# Patient Record
Sex: Male | Born: 1985
Health system: Southern US, Community
[De-identification: ages and names within clinical notes are randomized; demographics above are authoritative.]

## PROBLEM LIST (undated history)

## (undated) DIAGNOSIS — Z6833 Body mass index (BMI) 33.0-33.9, adult: Secondary | ICD-10-CM

## (undated) DIAGNOSIS — F988 Other specified behavioral and emotional disorders with onset usually occurring in childhood and adolescence: Secondary | ICD-10-CM

## (undated) DIAGNOSIS — E6609 Other obesity due to excess calories: Secondary | ICD-10-CM

## (undated) DIAGNOSIS — R7989 Other specified abnormal findings of blood chemistry: Secondary | ICD-10-CM

## (undated) HISTORY — DX: Other specified behavioral and emotional disorders with onset usually occurring in childhood and adolescence: F98.8

## (undated) HISTORY — DX: Other obesity due to excess calories: E66.09

## (undated) HISTORY — DX: Body mass index (bmi) 33.0-33.9, adult: Z68.33

## (undated) HISTORY — DX: Other specified abnormal findings of blood chemistry: R79.89

---
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
## 2014-11-19 HISTORY — PX: VASECTOMY: SHX75

## 2015-03-22 ENCOUNTER — Encounter: Payer: Self-pay | Admitting: Internal Medicine

## 2015-03-22 ENCOUNTER — Ambulatory Visit (INDEPENDENT_AMBULATORY_CARE_PROVIDER_SITE_OTHER): Payer: 59 | Admitting: Internal Medicine

## 2015-03-22 VITALS — BP 130/90 | HR 96 | Ht 70.0 in | Wt 217.0 lb

## 2015-03-22 DIAGNOSIS — Z3009 Encounter for other general counseling and advice on contraception: Secondary | ICD-10-CM

## 2015-03-22 DIAGNOSIS — Z309 Encounter for contraceptive management, unspecified: Secondary | ICD-10-CM

## 2015-03-22 DIAGNOSIS — F909 Attention-deficit hyperactivity disorder, unspecified type: Secondary | ICD-10-CM | POA: Diagnosis not present

## 2015-03-22 DIAGNOSIS — F988 Other specified behavioral and emotional disorders with onset usually occurring in childhood and adolescence: Secondary | ICD-10-CM | POA: Insufficient documentation

## 2015-03-22 MED ORDER — AMPHETAMINE-DEXTROAMPHETAMINE 30 MG PO TABS: ORAL_TABLET | ORAL | Status: DC

## 2015-03-22 MED ORDER — AMPHETAMINE-DEXTROAMPHETAMINE 20 MG PO TABS: ORAL_TABLET | ORAL | Status: DC

## 2015-03-22 MED ORDER — VITAMIN D 1000 UNITS PO TABS: 1000.0000 [IU] | ORAL_TABLET | Freq: Every day | ORAL | Status: AC

## 2015-03-22 NOTE — Assessment & Plan Note (Signed)
Adderral bid

## 2015-03-22 NOTE — Assessment & Plan Note (Signed)
  Dr Ottelin 

## 2015-03-22 NOTE — Progress Notes (Signed)
Pre visit review using our clinic review tool, if applicable. No additional management support is needed unless otherwise documented below in the visit note. 

## 2015-03-29 ENCOUNTER — Ambulatory Visit: Payer: 59 | Admitting: Internal Medicine

## 2015-03-29 NOTE — Progress Notes (Signed)
   Subjective:    Patient ID: Stephen Lawrence, male    DOB: 08-Feb-1986, 29 y.o.   MRN: [id number]  HPI  New pt Stephen Lawrence needs to address his ADD diagnosed a few years ago. He has been happy w/his current Rx regimen. Stephen Lawrence would like to be referred for vasectomy   Review of Systems  Constitutional: Negative for appetite change, fatigue and unexpected weight change.  HENT: Negative for congestion, dental problem, hearing loss, mouth sores, nosebleeds, sneezing, sore throat, tinnitus and trouble swallowing.   Eyes: Negative for itching and visual disturbance.  Respiratory: Negative for cough, chest tightness and wheezing.   Cardiovascular: Negative for chest pain, palpitations and leg swelling.  Gastrointestinal: Negative for nausea, abdominal pain, diarrhea, blood in stool and abdominal distention.  Genitourinary: Negative for frequency and hematuria.  Musculoskeletal: Negative for back pain, joint swelling, gait problem and neck pain.  Skin: Negative for rash and wound.  Neurological: Negative for dizziness, tremors, speech difficulty and weakness.  Psychiatric/Behavioral: Negative for suicidal ideas, hallucinations, behavioral problems, confusion, sleep disturbance, self-injury, dysphoric mood, decreased concentration and agitation. The patient is not nervous/anxious and is not hyperactive.        Objective:   Physical Exam  Constitutional: He is oriented to person, place, and time. He appears well-developed. No distress.  NAD  HENT:  Mouth/Throat: Oropharynx is clear and moist.  Eyes: Conjunctivae are normal. Pupils are equal, round, and reactive to light.  Neck: Normal range of motion. No JVD present. No thyromegaly present.  Cardiovascular: Normal rate, regular rhythm, normal heart sounds and intact distal pulses.  Exam reveals no gallop and no friction rub.   No murmur heard. Pulmonary/Chest: Effort normal and breath sounds normal. No respiratory distress. He has no wheezes. He  has no rales. He exhibits no tenderness.  Abdominal: Soft. Bowel sounds are normal. He exhibits no distension and no mass. There is no tenderness. There is no rebound and no guarding.  Musculoskeletal: Normal range of motion. He exhibits no edema or tenderness.  Lymphadenopathy:    He has no cervical adenopathy.  Neurological: He is alert and oriented to person, place, and time. He has normal reflexes. No cranial nerve deficit. He exhibits normal muscle tone. He displays a negative Romberg sign. Coordination and gait normal.  Skin: Skin is warm and dry. No rash noted.  Psychiatric: He has a normal mood and affect. His behavior is normal. Judgment and thought content normal.  Genitals exam - deferred         Assessment & Plan:

## 2015-05-17 ENCOUNTER — Encounter: Payer: Self-pay | Admitting: Internal Medicine

## 2015-05-30 MED ORDER — AMPHETAMINE-DEXTROAMPHETAMINE 20 MG PO TABS: ORAL_TABLET | ORAL | Status: DC

## 2015-05-30 MED ORDER — AMPHETAMINE-DEXTROAMPHETAMINE 30 MG PO TABS: ORAL_TABLET | ORAL | Status: DC

## 2015-05-30 NOTE — Telephone Encounter (Signed)
Needs Rx - done Stephen HaileyDustin, let me see you in Sept for a f/u Thx

## 2015-07-27 ENCOUNTER — Telehealth: Payer: Self-pay | Admitting: Internal Medicine

## 2015-07-27 MED ORDER — AMPHETAMINE-DEXTROAMPHETAMINE 20 MG PO TABS: ORAL_TABLET | ORAL | Status: DC

## 2015-07-27 MED ORDER — AMPHETAMINE-DEXTROAMPHETAMINE 30 MG PO TABS: ORAL_TABLET | ORAL | Status: DC

## 2015-07-27 NOTE — Telephone Encounter (Signed)
OK to fill this prescription with additional refills x0 Thank you!  

## 2015-07-27 NOTE — Telephone Encounter (Signed)
Rxs printed. Holding for MD sig at [facility] [employer]. Pt aware.

## 2015-07-27 NOTE — Telephone Encounter (Signed)
Pt needs refill on his amphetamine-dextroamphetamine (ADDERALL) 20 MG tablet [[id number]]

## 2015-08-23 ENCOUNTER — Telehealth: Payer: Self-pay | Admitting: Internal Medicine

## 2015-08-23 NOTE — Telephone Encounter (Signed)
Pt needs refill on both Adderall meds.

## 2015-08-24 ENCOUNTER — Other Ambulatory Visit: Payer: Self-pay

## 2015-08-24 MED ORDER — AMPHETAMINE-DEXTROAMPHETAMINE 20 MG PO TABS: ORAL_TABLET | ORAL | Status: DC

## 2015-08-24 MED ORDER — AMPHETAMINE-DEXTROAMPHETAMINE 30 MG PO TABS: ORAL_TABLET | ORAL | Status: DC

## 2015-08-30 ENCOUNTER — Ambulatory Visit (INDEPENDENT_AMBULATORY_CARE_PROVIDER_SITE_OTHER): Payer: 59 | Admitting: Internal Medicine

## 2015-08-30 ENCOUNTER — Encounter: Payer: Self-pay | Admitting: Internal Medicine

## 2015-08-30 VITALS — BP 118/80 | HR 84 | Temp 98.6°F | Wt 225.0 lb

## 2015-08-30 DIAGNOSIS — J069 Acute upper respiratory infection, unspecified: Secondary | ICD-10-CM | POA: Diagnosis not present

## 2015-08-30 DIAGNOSIS — F909 Attention-deficit hyperactivity disorder, unspecified type: Secondary | ICD-10-CM | POA: Diagnosis not present

## 2015-08-30 DIAGNOSIS — F988 Other specified behavioral and emotional disorders with onset usually occurring in childhood and adolescence: Secondary | ICD-10-CM

## 2015-08-30 MED ORDER — PROMETHAZINE-CODEINE 6.25-10 MG/5ML PO SYRP: 5.0000 mL | ORAL_SOLUTION | ORAL | Status: DC | PRN

## 2015-08-30 NOTE — Progress Notes (Signed)
   Subjective:  Patient ID: Stephen Lawrence, male    DOB: Mar 31, 1986  Age: 29 y.o. MRN: [id number]  CC: No chief complaint on file.   HPI Stephen Lawrence presents for URI sx's since yesterday  Outpatient Prescriptions Prior to Visit  Medication Sig Dispense Refill  . amphetamine-dextroamphetamine (ADDERALL) 20 MG tablet Take 1 q am 30 tablet 0  . amphetamine-dextroamphetamine (ADDERALL) 30 MG tablet Take after lunch 30 tablet 0  . cholecalciferol (VITAMIN D) 1000 UNITS tablet Take 1 tablet (1,000 Units total) by mouth daily. (Patient not taking: Reported on 08/30/2015) 100 tablet 3   No facility-administered medications prior to visit.    ROS Review of Systems  Constitutional: Positive for chills and fatigue. Negative for appetite change and unexpected weight change.  HENT: Positive for rhinorrhea, sinus pressure and sore throat. Negative for congestion, drooling, nosebleeds, sneezing and trouble swallowing.   Eyes: Negative for itching.  Respiratory: Positive for cough.   Cardiovascular: Negative for chest pain.  Gastrointestinal: Negative for nausea, diarrhea, blood in stool and abdominal distention.  Musculoskeletal: Negative for joint swelling.  Skin: Negative for rash.  Neurological: Negative for dizziness, tremors, speech difficulty and weakness.  Psychiatric/Behavioral: Negative for sleep disturbance, dysphoric mood and agitation. The patient is not nervous/anxious.     Objective:  BP 118/80 mmHg  Pulse 84  Temp(Src) 98.6 F (37 C) (Oral)  Wt 225 lb (102.059 kg)  SpO2 96%  BP Readings from Last 3 Encounters:  08/30/15 118/80  03/22/15 130/90    Wt Readings from Last 3 Encounters:  08/30/15 225 lb (102.059 kg)  03/22/15 217 lb (98.431 kg)    Physical Exam  Constitutional: He appears well-nourished. No distress.  HENT:  Mouth/Throat: No oropharyngeal exudate.  Neck: No thyromegaly present.  Cardiovascular: Regular rhythm.  Exam reveals no gallop.     Pulmonary/Chest: He has no wheezes. He has no rales.  Abdominal: He exhibits no distension. There is no tenderness.  Lymphadenopathy:    He has no cervical adenopathy.  Eryth throat  No results found for: WBC, HGB, HCT, PLT, GLUCOSE, CHOL, TRIG, HDL, LDLDIRECT, LDLCALC, ALT, AST, NA, K, CL, CREATININE, BUN, CO2, TSH, PSA, INR, GLUF, HGBA1C, MICROALBUR  Patient was never admitted.  Assessment & Plan:   Diagnoses and all orders for this visit:  Acute upper respiratory infection  Other orders -     promethazine-codeine (PHENERGAN WITH CODEINE) 6.25-10 MG/5ML syrup; Take 5 mLs by mouth every 4 (four) hours as needed.  I am having Mr. Sassano start on promethazine-codeine. I am also having him maintain his cholecalciferol, amphetamine-dextroamphetamine, and amphetamine-dextroamphetamine.  Meds ordered this encounter  Medications  . promethazine-codeine (PHENERGAN WITH CODEINE) 6.25-10 MG/5ML syrup    Sig: Take 5 mLs by mouth every 4 (four) hours as needed.    Dispense:  300 mL    Refill:  0     Follow-up: No Follow-up on file.  Sonda Primes, MD

## 2015-08-30 NOTE — Progress Notes (Signed)
Pre visit review using our clinic review tool, if applicable. No additional management support is needed unless otherwise documented below in the visit note. 

## 2015-08-30 NOTE — Patient Instructions (Signed)
Use over-the-counter  "cold" medicines  such as "Tylenol cold" , "Advil cold",  "Mucinex" or" Mucinex D"  for cough and congestion.   Avoid decongestants if you have high blood pressure and use "Afrin" nasal spray for nasal congestion as directed instead. Use" Delsym" or" Robitussin" cough syrup varietis for cough.  You can use plain "Tylenol" or "Advil" for fever, chills and achyness. Use Halls or Ricola cough drops.   "Common cold" symptoms are usually triggered by a virus.  The antibiotics are usually not necessary. On average, a" viral cold" illness would take 4-7 days to resolve. Please, make an appointment if you are not better or if you're worse.  

## 2015-08-30 NOTE — Assessment & Plan Note (Signed)
Chronic  On adderall  Potential benefits of a long term stimulants use as well as potential risks  and complications were explained to the patient and were aknowledged.

## 2015-08-30 NOTE — Assessment & Plan Note (Signed)
OTC meds Prom-cod Home today

## 2015-09-01 ENCOUNTER — Ambulatory Visit (INDEPENDENT_AMBULATORY_CARE_PROVIDER_SITE_OTHER): Payer: 59 | Admitting: Internal Medicine

## 2015-09-01 ENCOUNTER — Encounter: Payer: Self-pay | Admitting: Internal Medicine

## 2015-09-01 VITALS — BP 128/72 | HR 97 | Temp 98.2°F | Resp 16 | Ht 70.0 in | Wt 226.1 lb

## 2015-09-01 DIAGNOSIS — J069 Acute upper respiratory infection, unspecified: Secondary | ICD-10-CM

## 2015-09-01 MED ORDER — BENZONATATE 200 MG PO CAPS: 200.0000 mg | ORAL_CAPSULE | Freq: Three times a day (TID) | ORAL | Status: DC | PRN

## 2015-09-01 MED ORDER — AZITHROMYCIN 250 MG PO TABS: ORAL_TABLET | ORAL | Status: DC

## 2015-09-01 NOTE — Progress Notes (Signed)
   Subjective:    Patient ID: Stephen Lawrence, male    DOB: [birth year], 29 y.o.   MRN: [id number]  HPI The patient is a 29 YO man coming in for cough and SOB. He started having nasal congestion and drainage 4 days ago. Denies fevers or chills. Some mild myalgias. Started using nyquil and cough syrup with codeine which helps but makes him very drowsy so he can only take at night time. He is about 50% improved with the upper congestion but feels that his lungs are more congested. Still coughing with production. Mild decreased exercise tolerance. Fatigue.   Review of Systems  Constitutional: Positive for activity change and fatigue. Negative for fever, chills, appetite change and unexpected weight change.  HENT: Positive for congestion, postnasal drip and rhinorrhea. Negative for ear discharge, ear pain, facial swelling, hearing loss, sinus pressure, sore throat and trouble swallowing.   Eyes: Negative.   Respiratory: Positive for cough. Negative for chest tightness, shortness of breath and wheezing.   Cardiovascular: Negative for chest pain, palpitations and leg swelling.  Gastrointestinal: Positive for nausea. Negative for vomiting, abdominal pain, diarrhea and abdominal distention.  Musculoskeletal: Positive for myalgias.  Neurological: Negative.       Objective:   Physical Exam  Constitutional: He is oriented to person, place, and time. He appears well-developed and well-nourished.  HENT:  Head: Normocephalic and atraumatic.  Right Ear: External ear normal.  Left Ear: External ear normal.  Oropharynx with clear drainage and redness, nares swollen and redness minimal crusting  Eyes: EOM are normal.  Neck: Normal range of motion. No JVD present.  Cardiovascular: Normal rate and regular rhythm.   Pulmonary/Chest: Effort normal and breath sounds normal. No respiratory distress. He has no wheezes. He has no rales. He exhibits no tenderness.  Abdominal: Soft. Bowel sounds are normal. He  exhibits no distension. There is no tenderness. There is no rebound.  Lymphadenopathy:    He has no cervical adenopathy.  Neurological: He is alert and oriented to person, place, and time.  Skin: Skin is warm and dry.   Filed Vitals:   09/01/15 0825  BP: 128/72  Pulse: 97  Temp: 98.2 F (36.8 C)  TempSrc: Oral  Resp: 16  Height: 5\' 10"  (1.778 m)  Weight: 226 lb 1.9 oz (102.567 kg)  SpO2: 98%      Assessment & Plan:

## 2015-09-01 NOTE — Progress Notes (Signed)
Pre visit review using our clinic review tool, if applicable. No additional management support is needed unless otherwise documented below in the visit note. 

## 2015-09-01 NOTE — Assessment & Plan Note (Signed)
Still feel that this is viral and tessalon perles sent in to take during the daytime. Rx for azithromycin that he will start on Saturday if not improving. Explained likely course of symptoms and likely length of cough (several weeks).

## 2015-09-01 NOTE — Patient Instructions (Signed)
We have sent in tessalon perles which help to decrease the impulse to cough without making you sleepy. You can still use the cough syrup if you are at home.  We have also sent in azithromycin that you can start taking if you are not feeling better by Friday or Saturday. This is most likely still a virus which can take 7-10 days to be fully gone. Day 4-5 is the worst time for the virus.   If you need to take the antibiotic take 2 pills on day 1 then 1 pill daily on days 2-5.

## 2015-09-26 ENCOUNTER — Telehealth: Payer: Self-pay | Admitting: Internal Medicine

## 2015-09-26 NOTE — Telephone Encounter (Signed)
Pt needs refill on Both Adderalls

## 2015-09-27 MED ORDER — AMPHETAMINE-DEXTROAMPHETAMINE 20 MG PO TABS: ORAL_TABLET | ORAL | Status: DC

## 2015-09-27 MED ORDER — AMPHETAMINE-DEXTROAMPHETAMINE 30 MG PO TABS: ORAL_TABLET | ORAL | Status: DC

## 2015-09-27 NOTE — Telephone Encounter (Signed)
Rxs printed/signed/given to pt.

## 2015-09-27 NOTE — Telephone Encounter (Signed)
OK to fill both prescriptions with additional refills x0 Thank you!  

## 2015-10-25 ENCOUNTER — Telehealth: Payer: Self-pay | Admitting: Internal Medicine

## 2015-10-25 NOTE — Telephone Encounter (Signed)
Pt is requesting refill on adderall 

## 2015-10-25 NOTE — Telephone Encounter (Signed)
OK to fill this prescription with additional refills x0 Thank you!  

## 2015-10-26 MED ORDER — AMPHETAMINE-DEXTROAMPHETAMINE 30 MG PO TABS: ORAL_TABLET | ORAL | Status: DC

## 2015-10-26 MED ORDER — AMPHETAMINE-DEXTROAMPHETAMINE 20 MG PO TABS: ORAL_TABLET | ORAL | Status: DC

## 2015-10-26 NOTE — Telephone Encounter (Signed)
Rxs printed/signed/given to pt.  

## 2015-11-23 ENCOUNTER — Telehealth: Payer: Self-pay | Admitting: *Deleted
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
## 2015-11-23 NOTE — Telephone Encounter (Signed)
Yes. Thx.

## 2015-11-24 MED ORDER — AMPHETAMINE-DEXTROAMPHETAMINE 30 MG PO TABS: ORAL_TABLET | ORAL | Status: DC

## 2015-11-24 MED ORDER — AMPHETAMINE-DEXTROAMPHETAMINE 20 MG PO TABS
ORAL_TABLET | ORAL | Status: DC
Start: 2015-11-24 — End: 2015-12-06

## 2015-11-24 MED FILL — AMPHETAMINE SALTS 30 MG TAB: 30 | 30 days supply | Qty: 30 | Fill #0

## 2015-11-24 MED FILL — AMPHETAMINE SALTS 20 MG TAB: 20 | 30 days supply | Qty: 30 | Fill #0

## 2015-11-24 NOTE — Telephone Encounter (Signed)
Rx printed/signed/given to pt.

## 2015-12-01 ENCOUNTER — Encounter: Payer: 59 | Admitting: Internal Medicine

## 2015-12-06 ENCOUNTER — Encounter: Payer: Self-pay | Admitting: Internal Medicine

## 2015-12-06 ENCOUNTER — Ambulatory Visit (INDEPENDENT_AMBULATORY_CARE_PROVIDER_SITE_OTHER): Payer: 59 | Admitting: Internal Medicine

## 2015-12-06 VITALS — BP 112/74 | HR 72 | Ht 70.0 in | Wt 217.0 lb

## 2015-12-06 DIAGNOSIS — Z0000 Encounter for general adult medical examination without abnormal findings: Secondary | ICD-10-CM | POA: Diagnosis not present

## 2015-12-06 DIAGNOSIS — F988 Other specified behavioral and emotional disorders with onset usually occurring in childhood and adolescence: Secondary | ICD-10-CM

## 2015-12-06 MED ORDER — AMPHETAMINE-DEXTROAMPHETAMINE 20 MG PO TABS: ORAL_TABLET | ORAL | Status: DC

## 2015-12-06 MED ORDER — AMPHETAMINE-DEXTROAMPHETAMINE 30 MG PO TABS: ORAL_TABLET | ORAL | Status: DC

## 2015-12-06 NOTE — Assessment & Plan Note (Signed)
We discussed age appropriate health related issues, including available/recomended screening tests and vaccinations. We discussed a need for adhering to healthy diet and exercise. Labs/EKG were reviewed/ordered. All questions were answered.   

## 2015-12-06 NOTE — Progress Notes (Signed)
Subjective:  Patient ID: Stephen Lawrence, male    DOB: 28-Feb-1986  Age: 30 y.o. MRN: [id number]  CC: Annual Exam   HPI Stephen Lawrence presents for a well exam. F/u ADD. Pt lost wt on diet  Outpatient Prescriptions Prior to Visit  Medication Sig Dispense Refill  . amphetamine-dextroamphetamine (ADDERALL) 20 MG tablet Take 1 q am 30 tablet 0  . amphetamine-dextroamphetamine (ADDERALL) 30 MG tablet Take after lunch 30 tablet 0  . cholecalciferol (VITAMIN D) 1000 UNITS tablet Take 1 tablet (1,000 Units total) by mouth daily. (Patient not taking: Reported on 12/06/2015) 100 tablet 3  . azithromycin (ZITHROMAX) 250 MG tablet Day 1 take 2 pills, days 2-5 take 1 pill daily. (Patient not taking: Reported on 12/06/2015) 6 tablet 0  . benzonatate (TESSALON) 200 MG capsule Take 1 capsule (200 mg total) by mouth 3 (three) times daily as needed for cough. (Patient not taking: Reported on 12/06/2015) 90 capsule 0  . promethazine-codeine (PHENERGAN WITH CODEINE) 6.25-10 MG/5ML syrup Take 5 mLs by mouth every 4 (four) hours as needed. (Patient not taking: Reported on 12/06/2015) 300 mL 0   No facility-administered medications prior to visit.    ROS Review of Systems  Constitutional: Negative for appetite change, fatigue and unexpected weight change.  HENT: Negative for congestion, nosebleeds, sneezing, sore throat and trouble swallowing.   Eyes: Negative for itching and visual disturbance.  Respiratory: Negative for cough.   Cardiovascular: Negative for chest pain, palpitations and leg swelling.  Gastrointestinal: Negative for nausea, diarrhea, blood in stool and abdominal distention.  Genitourinary: Negative for frequency and hematuria.  Musculoskeletal: Negative for back pain, joint swelling, gait problem and neck pain.  Skin: Negative for rash.  Neurological: Negative for dizziness, tremors, speech difficulty and weakness.  Psychiatric/Behavioral: Negative for suicidal ideas, sleep disturbance,  dysphoric mood and agitation. The patient is not nervous/anxious.     Objective:  BP 112/74 mmHg  Pulse 72  Ht  (1.778 m)  Wt 217 lb (98.431 kg)  BMI 31.14 kg/m2  SpO2 98%  BP Readings from Last 3 Encounters:  12/06/15 112/74  09/01/15 128/72  08/30/15 118/80    Wt Readings from Last 3 Encounters:  12/06/15 217 lb (98.431 kg)  09/01/15 226 lb 1.9 oz (102.567 kg)  08/30/15 225 lb (102.059 kg)    Physical Exam  Constitutional: He is oriented to person, place, and time. He appears well-developed. No distress.  NAD  HENT:  Mouth/Throat: Oropharynx is clear and moist.  Eyes: Conjunctivae are normal. Pupils are equal, round, and reactive to light.  Neck: Normal range of motion. No JVD present. No thyromegaly present.  Cardiovascular: Normal rate, regular rhythm, normal heart sounds and intact distal pulses.  Exam reveals no gallop and no friction rub.   No murmur heard. Pulmonary/Chest: Effort normal and breath sounds normal. No respiratory distress. He has no wheezes. He has no rales. He exhibits no tenderness.  Abdominal: Soft. Bowel sounds are normal. He exhibits no distension and no mass. There is no tenderness. There is no rebound and no guarding.  Musculoskeletal: Normal range of motion. He exhibits tenderness. He exhibits no edema.  Lymphadenopathy:    He has no cervical adenopathy.  Neurological: He is alert and oriented to person, place, and time. He has normal reflexes. No cranial nerve deficit. He exhibits normal muscle tone. He displays a negative Romberg sign. Coordination and gait normal.  Skin: Skin is warm and dry. No rash noted.  Psychiatric: He has  a normal mood and affect. His behavior is normal. Judgment and thought content normal.    No results found for: WBC, HGB, HCT, PLT, GLUCOSE, CHOL, TRIG, HDL, LDLDIRECT, LDLCALC, ALT, AST, NA, K, CL, CREATININE, BUN, CO2, TSH, PSA, INR, GLUF, HGBA1C, MICROALBUR  Patient was never admitted.  Assessment & Plan:     Stephen Lawrence was seen today for annual exam.  Diagnoses and all orders for this visit:  Well adult exam -     CBC with Differential/Platelet; Future -     Basic metabolic panel; Future -     Hepatic function panel; Future -     Lipid panel; Future -     TSH; Future -     Urinalysis; Future  Other orders -     Discontinue: amphetamine-dextroamphetamine (ADDERALL) 20 MG tablet; Take 1 q am -     Discontinue: amphetamine-dextroamphetamine (ADDERALL) 30 MG tablet; Take after lunch -     Discontinue: amphetamine-dextroamphetamine (ADDERALL) 20 MG tablet; Take 1 q am -     Discontinue: amphetamine-dextroamphetamine (ADDERALL) 30 MG tablet; Take after lunch -     amphetamine-dextroamphetamine (ADDERALL) 20 MG tablet; Take 1 q am -     amphetamine-dextroamphetamine (ADDERALL) 30 MG tablet; Take after lunch  I have discontinued Mr. Cull's promethazine-codeine, benzonatate, and azithromycin. I am also having him maintain his cholecalciferol, amphetamine-dextroamphetamine, and amphetamine-dextroamphetamine.  Meds ordered this encounter  Medications  . DISCONTD: amphetamine-dextroamphetamine (ADDERALL) 20 MG tablet    Sig: Take 1 q am    Dispense:  30 tablet    Refill:  0    Please fill on 12/25/15.  Marland Kitchen DISCONTD: amphetamine-dextroamphetamine (ADDERALL) 30 MG tablet    Sig: Take after lunch    Dispense:  30 tablet    Refill:  0    Please fill on 12/25/15.  Marland Kitchen DISCONTD: amphetamine-dextroamphetamine (ADDERALL) 20 MG tablet    Sig: Take 1 q am    Dispense:  30 tablet    Refill:  0    Please fill on 01/22/16.  Marland Kitchen DISCONTD: amphetamine-dextroamphetamine (ADDERALL) 30 MG tablet    Sig: Take after lunch    Dispense:  30 tablet    Refill:  0    Please fill on 01/22/16.  Marland Kitchen amphetamine-dextroamphetamine (ADDERALL) 20 MG tablet    Sig: Take 1 q am    Dispense:  30 tablet    Refill:  0    Please fill on 02/22/16.  Marland Kitchen amphetamine-dextroamphetamine (ADDERALL) 30 MG tablet    Sig: Take after lunch     Dispense:  30 tablet    Refill:  0    Please fill on 02/22/16.     Follow-up: Return in about 3 months (around 03/05/2016) for a follow-up visit.  Sonda Primes, MD

## 2015-12-06 NOTE — Progress Notes (Signed)
Pre visit review using our clinic review tool, if applicable. No additional management support is needed unless otherwise documented below in the visit note. 

## 2015-12-06 NOTE — Assessment & Plan Note (Signed)
Adderall renewed.

## 2015-12-26 MED FILL — AMPHETAMINE SALTS 20 MG TAB: 20 | 30 days supply | Qty: 30 | Fill #0

## 2015-12-26 MED FILL — AMPHETAMINE SALTS 30 MG TAB: 30 | 30 days supply | Qty: 30 | Fill #0

## 2016-01-23 MED FILL — DEXTROAMP-AMP 30 MG TABLET: 30 | 30 days supply | Qty: 30 | Fill #0

## 2016-01-23 MED FILL — AMPHETAMINE SALTS 20 MG TAB: 20 | 30 days supply | Qty: 30 | Fill #0

## 2016-02-20 MED FILL — AMPHETAMINE SALTS 20 MG TAB: 20 | 30 days supply | Qty: 30 | Fill #0

## 2016-02-20 MED FILL — DEXTROAMP-AMP 30 MG TABLET: 30 | 30 days supply | Qty: 30 | Fill #0

## 2016-02-27 ENCOUNTER — Encounter: Payer: Self-pay | Admitting: Internal Medicine

## 2016-02-27 ENCOUNTER — Ambulatory Visit (INDEPENDENT_AMBULATORY_CARE_PROVIDER_SITE_OTHER): Payer: 59 | Admitting: Internal Medicine

## 2016-02-27 VITALS — BP 118/80 | HR 61 | Temp 97.8°F | Resp 16 | Ht 70.0 in | Wt 218.0 lb

## 2016-02-27 DIAGNOSIS — J0100 Acute maxillary sinusitis, unspecified: Secondary | ICD-10-CM

## 2016-02-27 DIAGNOSIS — H68012 Acute Eustachian salpingitis, left ear: Secondary | ICD-10-CM | POA: Diagnosis not present

## 2016-02-27 DIAGNOSIS — H68019 Acute Eustachian salpingitis, unspecified ear: Secondary | ICD-10-CM | POA: Insufficient documentation

## 2016-02-27 MED ORDER — METHYLPREDNISOLONE ACETATE 80 MG/ML IJ SUSP
120.0000 mg | Freq: Once | INTRAMUSCULAR | Status: AC
  Administered 2016-02-27: 120 mg via INTRAMUSCULAR

## 2016-02-27 MED ORDER — AMOXICILLIN-POT CLAVULANATE 875-125 MG PO TABS: 1.0000 | ORAL_TABLET | Freq: Two times a day (BID) | ORAL | Status: DC

## 2016-02-27 MED ORDER — CETIRIZINE-PSEUDOEPHEDRINE ER 5-120 MG PO TB12: 1.0000 | ORAL_TABLET | Freq: Two times a day (BID) | ORAL | Status: DC

## 2016-02-27 MED FILL — AMOX-CLAV 875-125 MG TABLET: 875-125 | 10 days supply | Qty: 20 | Fill #0

## 2016-02-27 NOTE — Progress Notes (Signed)
Subjective:  Patient ID: Stephen Lawrence, male    DOB: 02/19/1986  Age: 30 y.o. MRN: [id number]  CC: Sinusitis   HPI Stephen Lawrence presents for a 3 day hx of severe nasal congestion, PND, right facial pain, left earache, think/yellow nasal phlegm, and chills but no fever.  Outpatient Prescriptions Prior to Visit  Medication Sig Dispense Refill  . amphetamine-dextroamphetamine (ADDERALL) 20 MG tablet Take 1 q am 30 tablet 0  . amphetamine-dextroamphetamine (ADDERALL) 30 MG tablet Take after lunch 30 tablet 0  . cholecalciferol (VITAMIN D) 1000 UNITS tablet Take 1 tablet (1,000 Units total) by mouth daily. (Patient not taking: Reported on 02/27/2016) 100 tablet 3   No facility-administered medications prior to visit.    ROS Review of Systems  Constitutional: Positive for chills. Negative for fever, diaphoresis, activity change, appetite change, fatigue and unexpected weight change.  HENT: Positive for congestion, ear pain, postnasal drip, rhinorrhea, sinus pressure and sneezing. Negative for facial swelling, sore throat and trouble swallowing.   Eyes: Negative.   Respiratory: Negative.  Negative for cough, choking, chest tightness, shortness of breath and stridor.   Cardiovascular: Negative.  Negative for chest pain, palpitations and leg swelling.  Gastrointestinal: Negative.  Negative for nausea, vomiting, abdominal pain, diarrhea and constipation.  Endocrine: Negative.   Genitourinary: Negative.   Musculoskeletal: Negative.  Negative for back pain, arthralgias and neck pain.  Skin: Negative.  Negative for color change and rash.  Allergic/Immunologic: Negative.   Neurological: Negative.   Hematological: Negative.  Negative for adenopathy. Does not bruise/bleed easily.  Psychiatric/Behavioral: Negative.     Objective:  BP 118/80 mmHg  Pulse 61  Temp(Src) 97.8 F (36.6 C) (Oral)  Resp 16  Ht 5\' 10"  (1.778 m)  Wt 218 lb (98.884 kg)  BMI 31.28 kg/m2  SpO2 98%  BP Readings  from Last 3 Encounters:  02/27/16 118/80  12/06/15 112/74  09/01/15 128/72    Wt Readings from Last 3 Encounters:  02/27/16 218 lb (98.884 kg)  12/06/15 217 lb (98.431 kg)  09/01/15 226 lb 1.9 oz (102.567 kg)    Physical Exam  Constitutional:  Non-toxic appearance. He does not have a sickly appearance. He does not appear ill. No distress.  HENT:  Right Ear: Hearing, tympanic membrane, external ear and ear canal normal.  Left Ear: Hearing, external ear and ear canal normal. Tympanic membrane is erythematous and retracted. Tympanic membrane is not injected and not perforated.  Nose: Mucosal edema and rhinorrhea present. Right sinus exhibits maxillary sinus tenderness. Right sinus exhibits no frontal sinus tenderness. Left sinus exhibits no maxillary sinus tenderness.  Mouth/Throat: Oropharynx is clear and moist and mucous membranes are normal. Mucous membranes are not pale, not dry and not cyanotic. No oral lesions. No trismus in the jaw. No uvula swelling. No oropharyngeal exudate, posterior oropharyngeal edema, posterior oropharyngeal erythema or tonsillar abscesses.  Eyes: Conjunctivae are normal. Right eye exhibits no discharge. Left eye exhibits no discharge. No scleral icterus.  Neck: Normal range of motion. Neck supple. No JVD present. No tracheal deviation present. No thyromegaly present.  Cardiovascular: Normal rate, regular rhythm, normal heart sounds and intact distal pulses.  Exam reveals no gallop and no friction rub.   No murmur heard. Pulmonary/Chest: Effort normal and breath sounds normal. No stridor. No respiratory distress. He has no wheezes. He has no rales. He exhibits no tenderness.  Abdominal: Soft. Bowel sounds are normal. He exhibits no distension and no mass. There is no tenderness. There is no  rebound and no guarding.  Musculoskeletal: Normal range of motion. He exhibits no edema or tenderness.  Lymphadenopathy:    He has no cervical adenopathy.  Skin: He is not  diaphoretic.    No results found for: WBC, HGB, HCT, PLT, GLUCOSE, CHOL, TRIG, HDL, LDLDIRECT, LDLCALC, ALT, AST, NA, K, CL, CREATININE, BUN, CO2, TSH, PSA, INR, GLUF, HGBA1C, MICROALBUR  Patient was never admitted.  Assessment & Plan:   Stephen Lawrence was seen today for sinusitis.  Diagnoses and all orders for this visit:  Acute maxillary sinusitis, recurrence not specified -     amoxicillin-clavulanate (AUGMENTIN) 875-125 MG tablet; Take 1 tablet by mouth 2 (two) times daily. -     cetirizine-pseudoephedrine (ZYRTEC-D ALLERGY & CONGESTION) 5-120 MG tablet; Take 1 tablet by mouth 2 (two) times daily. -     methylPREDNISolone acetate (DEPO-MEDROL) injection 120 mg; Inject 1.5 mLs (120 mg total) into the muscle once.  Acute eustachian tube salpingitis, left -     cetirizine-pseudoephedrine (ZYRTEC-D ALLERGY & CONGESTION) 5-120 MG tablet; Take 1 tablet by mouth 2 (two) times daily.   I am having Stephen Lawrence start on amoxicillin-clavulanate and cetirizine-pseudoephedrine. I am also having him maintain his cholecalciferol, amphetamine-dextroamphetamine, and amphetamine-dextroamphetamine. We administered methylPREDNISolone acetate.  Meds ordered this encounter  Medications  . amoxicillin-clavulanate (AUGMENTIN) 875-125 MG tablet    Sig: Take 1 tablet by mouth 2 (two) times daily.    Dispense:  20 tablet    Refill:  0  . cetirizine-pseudoephedrine (ZYRTEC-D ALLERGY & CONGESTION) 5-120 MG tablet    Sig: Take 1 tablet by mouth 2 (two) times daily.    Dispense:  60 tablet    Refill:  3  . methylPREDNISolone acetate (DEPO-MEDROL) injection 120 mg    Sig:      Follow-up: Return if symptoms worsen or fail to improve.  Sanda Linger, MD

## 2016-02-27 NOTE — Progress Notes (Signed)
Pre visit review using our clinic review tool, if applicable. No additional management support is needed unless otherwise documented below in the visit note. 

## 2016-02-27 NOTE — Patient Instructions (Signed)

## 2016-03-21 ENCOUNTER — Other Ambulatory Visit: Payer: Self-pay | Admitting: Internal Medicine

## 2016-03-22 ENCOUNTER — Telehealth: Payer: Self-pay | Admitting: Geriatric Medicine

## 2016-03-22 MED ORDER — AMPHETAMINE-DEXTROAMPHETAMINE 20 MG PO TABS: ORAL_TABLET | ORAL | Status: DC

## 2016-03-22 MED ORDER — AMPHETAMINE-DEXTROAMPHETAMINE 30 MG PO TABS: ORAL_TABLET | ORAL | Status: DC

## 2016-03-22 NOTE — Telephone Encounter (Signed)
Done. Thx.

## 2016-03-22 NOTE — Telephone Encounter (Signed)
Patient needs a refill on his adderalls, please advise, thanks.

## 2016-03-23 MED FILL — DEXTROAMP-AMPHETAMIN 20 MG: 20 | 30 days supply | Qty: 30 | Fill #0

## 2016-03-23 MED FILL — AMPHETAMINE SALTS 30 MG TAB: 30 | 30 days supply | Qty: 30 | Fill #0

## 2016-03-30 ENCOUNTER — Telehealth: Payer: Self-pay | Admitting: *Deleted

## 2016-03-30 DIAGNOSIS — Z201 Contact with and (suspected) exposure to tuberculosis: Secondary | ICD-10-CM

## 2016-03-30 NOTE — Telephone Encounter (Signed)
Pt requesting TB Gold test. He states he was possibly exposed here in [facility] office by a TB positive pt Ok to order lab per PCP.

## 2016-04-23 ENCOUNTER — Other Ambulatory Visit: Payer: Self-pay | Admitting: Internal Medicine

## 2016-04-27 MED ORDER — AMPHETAMINE-DEXTROAMPHETAMINE 20 MG PO TABS: ORAL_TABLET | ORAL | Status: DC

## 2016-04-27 MED ORDER — AMPHETAMINE-DEXTROAMPHETAMINE 30 MG PO TABS: ORAL_TABLET | ORAL | Status: DC

## 2016-04-27 MED FILL — DEXTROAMP-AMP 30 MG TABLET: 30 | 30 days supply | Qty: 30 | Fill #0

## 2016-04-27 MED FILL — DEXTROAMP-AMPHETAMIN 20 MG: 20 | 30 days supply | Qty: 30 | Fill #0

## 2016-04-27 NOTE — Telephone Encounter (Signed)
adderrall rx printed, waiting for plot to sign

## 2016-04-27 NOTE — Telephone Encounter (Signed)
rx signed and handed to Adolphe

## 2016-04-27 NOTE — Addendum Note (Signed)
Addended by: Alonna MiniumWILLIAMS, TAMARA P on: 04/27/2016 08:27 AM   Modules accepted: Orders

## 2016-05-14 ENCOUNTER — Encounter: Payer: Self-pay | Admitting: Family

## 2016-05-14 ENCOUNTER — Ambulatory Visit (INDEPENDENT_AMBULATORY_CARE_PROVIDER_SITE_OTHER): Payer: 59 | Admitting: Family

## 2016-05-14 VITALS — BP 132/92 | HR 68 | Temp 97.9°F | Resp 16 | Ht 70.0 in | Wt 220.0 lb

## 2016-05-14 DIAGNOSIS — J309 Allergic rhinitis, unspecified: Secondary | ICD-10-CM

## 2016-05-14 MED ORDER — HYDROCOD POLST-CPM POLST ER 10-8 MG/5ML PO SUER: 5.0000 mL | Freq: Every evening | ORAL | Status: DC | PRN

## 2016-05-14 MED FILL — HYDROCODONE-CHLORPHENIRAM S: 10-8 | 23 days supply | Qty: 115 | Fill #0

## 2016-05-14 NOTE — Progress Notes (Signed)
Subjective:    Patient ID: Stephen Lawrence, male    DOB: Dec 01, 1985, 30 y.o.   MRN: [id number]  Chief Complaint  Patient presents with  . Cough    was moving saturday and thinks that he inhaled a lot of dust, has a productive cough, both nasal and chest congestion    HPI:  Stephen Lawrence is a 30 y.o. male who  has a past medical history of ADD (attention deficit disorder). and presents today for an acute office visit.  This is a new problem.Associated symptom of itchy watery eyes, rhinorrhea, chest tightness, shortness of breath with productive cough producing a yellow to brown phlegm has been going on for approximately 2-1/2 days. Reports overall malaise. Timing of symptoms is generally worsen 90 and in the mornings. No fevers. Severity is enough to affect his sleep. Modifying factors include Advil sinus which has helped a little. Most recent antibiotic use within the last 2 months.   No Known Allergies   Current Outpatient Prescriptions on File Prior to Visit  Medication Sig Dispense Refill  . amphetamine-dextroamphetamine (ADDERALL) 20 MG tablet Take 1 q am 30 tablet 0  . amphetamine-dextroamphetamine (ADDERALL) 30 MG tablet Take after lunch 30 tablet 0   No current facility-administered medications on file prior to visit.    Review of Systems  Constitutional: Positive for fatigue. Negative for fever, chills and diaphoresis.  HENT: Positive for congestion, postnasal drip, rhinorrhea and sneezing.   Eyes: Positive for itching.  Respiratory: Positive for cough, chest tightness and shortness of breath.   Neurological: Negative for headaches.      Objective:    BP 132/92 mmHg  Pulse 68  Temp(Src) 97.9 F (36.6 C) (Oral)  Resp 16  Ht 5\' 10"  (1.778 m)  Wt 220 lb (99.791 kg)  BMI 31.57 kg/m2  SpO2 99% Nursing note and vital signs reviewed.  Physical Exam  Constitutional: He is oriented to person, place, and time. He appears well-developed and well-nourished. No  distress.  HENT:  Right Ear: Hearing, tympanic membrane, external ear and ear canal normal.  Left Ear: Hearing, tympanic membrane, external ear and ear canal normal.  Nose: Rhinorrhea present. No mucosal edema or sinus tenderness. Right sinus exhibits no maxillary sinus tenderness and no frontal sinus tenderness. Left sinus exhibits no maxillary sinus tenderness and no frontal sinus tenderness.  Mouth/Throat: Uvula is midline and mucous membranes are normal.  Post-nasal drip present  Cardiovascular: Normal rate, regular rhythm, normal heart sounds and intact distal pulses.   Pulmonary/Chest: Effort normal and breath sounds normal. No respiratory distress. He has no wheezes. He has no rales. He exhibits no tenderness.  Neurological: He is alert and oriented to person, place, and time.  Skin: Skin is warm and dry.  Psychiatric: He has a normal mood and affect. His behavior is normal. Judgment and thought content normal.       Assessment & Plan:   Problem List Items Addressed This Visit      Respiratory   Allergic rhinitis - Primary    Symptoms and exam consistent with allergic rhinitis most likely triggered by dust. Start second generation antihistamine and inhaled nasal corticosteroid. Start Tussionex as needed for cough and sleep. Continue over-the-counter medications as needed for symptom relief and supportive care. Follow-up if symptoms worsen or do not improve.      Relevant Medications   chlorpheniramine-HYDROcodone (TUSSIONEX PENNKINETIC ER) 10-8 MG/5ML SUER       I have discontinued Mr. Resh's amoxicillin-clavulanate and  cetirizine-pseudoephedrine. I am also having him start on chlorpheniramine-HYDROcodone. Additionally, I am having him maintain his amphetamine-dextroamphetamine and amphetamine-dextroamphetamine.   Meds ordered this encounter  Medications  . chlorpheniramine-HYDROcodone (TUSSIONEX PENNKINETIC ER) 10-8 MG/5ML SUER    Sig: Take 5 mLs by mouth at bedtime as  needed.    Dispense:  115 mL    Refill:  0    Order Specific Question:  Supervising Provider    Answer:  Hillard DankerRAWFORD, ELIZABETH A [[id number]]     Follow-up: Return if symptoms worsen or fail to improve.  Jeanine Luzalone, Gregory, FNP

## 2016-05-14 NOTE — Assessment & Plan Note (Signed)
Symptoms and exam consistent with allergic rhinitis most likely triggered by dust. Start second generation antihistamine and inhaled nasal corticosteroid. Start Tussionex as needed for cough and sleep. Continue over-the-counter medications as needed for symptom relief and supportive care. Follow-up if symptoms worsen or do not improve.

## 2016-05-14 NOTE — Patient Instructions (Signed)
Thank you for choosing [facility].  Summary/Instructions:  Your prescription(s) have been submitted to your pharmacy or been printed and provided for you. Please take as directed and contact our office if you believe you are having problem(s) with the medication(s) or have any questions.  If your symptoms worsen or fail to improve, please contact our office for further instruction, or in case of emergency go directly to the emergency room at the closest medical facility.   General Recommendations:    Please drink plenty of fluids.  Get plenty of rest   Sleep in humidified air  Use saline nasal sprays  Netti pot   OTC Medications:  Decongestants - helps relieve congestion   Flonase (generic fluticasone) or Nasacort (generic triamcinolone) - please make sure to use the "cross-over" technique at a 45 degree angle towards the opposite eye as opposed to straight up the nasal passageway.   Sudafed (generic pseudoephedrine - Note this is the one that is available behind the pharmacy counter); Products with phenylephrine (-PE) may also be used but is often not as effective as pseudoephedrine.   If you have HIGH BLOOD PRESSURE - Coricidin HBP; AVOID any product that is -D as this contains pseudoephedrine which may increase your blood pressure.  Afrin (oxymetazoline) every 6-8 hours for up to 3 days.   Allergies - helps relieve runny nose, itchy eyes and sneezing   Claritin (generic loratidine), Allegra (fexofenidine), Xyzal (levocyterizine) or Zyrtec (generic cyrterizine) for runny nose. These medications should not cause drowsiness.  Note - Benadryl (generic diphenhydramine) may be used however may cause drowsiness  Cough -   Delsym or Robitussin (generic dextromethorphan)  Expectorants - helps loosen mucus to ease removal   Mucinex (generic guaifenesin) as directed on the package.  Headaches / General Aches   Tylenol (generic acetaminophen) - DO NOT EXCEED 3  grams (3,000 mg) in a 24 hour time period  Advil/Motrin (generic ibuprofen)   Sore Throat -   Salt water gargle   Chloraseptic (generic benzocaine) spray or lozenges / Sucrets (generic dyclonine)    Allergic Rhinitis Allergic rhinitis is when the mucous membranes in the nose respond to allergens. Allergens are particles in the air that cause your body to have an allergic reaction. This causes you to release allergic antibodies. Through a chain of events, these eventually cause you to release histamine into the blood stream. Although meant to protect the body, it is this release of histamine that causes your discomfort, such as frequent sneezing, congestion, and an itchy, runny nose.  CAUSES Seasonal allergic rhinitis (hay fever) is caused by pollen allergens that may come from grasses, trees, and weeds. Year-round allergic rhinitis (perennial allergic rhinitis) is caused by allergens such as house dust mites, pet dander, and mold spores. SYMPTOMS  Nasal stuffiness (congestion).  Itchy, runny nose with sneezing and tearing of the eyes. DIAGNOSIS Your health care provider can help you determine the allergen or allergens that trigger your symptoms. If you and your health care provider are unable to determine the allergen, skin or blood testing may be used. Your health care provider will diagnose your condition after taking your health history and performing a physical exam. Your health care provider may assess you for other related conditions, such as asthma, pink eye, or an ear infection. TREATMENT Allergic rhinitis does not have a cure, but it can be controlled by:  Medicines that block allergy symptoms. These may include allergy shots, nasal sprays, and oral antihistamines.  Avoiding the allergen.  Hay fever may often be treated with antihistamines in pill or nasal spray forms. Antihistamines block the effects of histamine. There are over-the-counter medicines that may help with nasal  congestion and swelling around the eyes. Check with your health care provider before taking or giving this medicine. If avoiding the allergen or the medicine prescribed do not work, there are many new medicines your health care provider can prescribe. Stronger medicine may be used if initial measures are ineffective. Desensitizing injections can be used if medicine and avoidance does not work. Desensitization is when a patient is given ongoing shots until the body becomes less sensitive to the allergen. Make sure you follow up with your health care provider if problems continue. HOME CARE INSTRUCTIONS It is not possible to completely avoid allergens, but you can reduce your symptoms by taking steps to limit your exposure to them. It helps to know exactly what you are allergic to so that you can avoid your specific triggers. SEEK MEDICAL CARE IF:  You have a fever.  You develop a cough that does not stop easily (persistent).  You have shortness of breath.  You start wheezing.  Symptoms interfere with normal daily activities.   This information is not intended to replace advice given to you by your health care provider. Make sure you discuss any questions you have with your health care provider.   Document Released: 07/31/2001 Document Revised: 11/26/2014 Document Reviewed: 07/13/2013 Elsevier Interactive Patient Education [employer]2016 Elsevier Inc.

## 2016-05-14 NOTE — Progress Notes (Signed)
Pre visit review using our clinic review tool, if applicable. No additional management support is needed unless otherwise documented below in the visit note. 

## 2016-05-23 ENCOUNTER — Telehealth: Payer: Self-pay | Admitting: Internal Medicine

## 2016-05-24 MED ORDER — AMPHETAMINE-DEXTROAMPHETAMINE 30 MG PO TABS: ORAL_TABLET | ORAL | Status: DC

## 2016-05-24 MED ORDER — AMPHETAMINE-DEXTROAMPHETAMINE 20 MG PO TABS: ORAL_TABLET | ORAL | Status: DC

## 2016-05-24 NOTE — Addendum Note (Signed)
Addended by: Hillard DankerRAWFORD, ELIZABETH A on: 05/24/2016 09:53 AM   Modules accepted: Orders

## 2016-05-24 NOTE — Telephone Encounter (Signed)
Printed and signed, no prior UDS please get with pickup.

## 2016-05-24 NOTE — Telephone Encounter (Signed)
Rx given to pt. Advised UDS is needed.

## 2016-05-25 MED FILL — DEXTROAMP-AMP 30 MG TABLET: 30 | 30 days supply | Qty: 30 | Fill #0

## 2016-05-25 MED FILL — DEXTROAMP-AMPHETAMIN 20 MG: 20 | 30 days supply | Qty: 30 | Fill #0

## 2016-06-08 ENCOUNTER — Other Ambulatory Visit: Payer: Self-pay

## 2016-06-08 ENCOUNTER — Ambulatory Visit (INDEPENDENT_AMBULATORY_CARE_PROVIDER_SITE_OTHER): Payer: 59 | Admitting: Family Medicine

## 2016-06-08 DIAGNOSIS — M659 Synovitis and tenosynovitis, unspecified: Secondary | ICD-10-CM | POA: Insufficient documentation

## 2016-06-08 DIAGNOSIS — M6588 Other synovitis and tenosynovitis, other site: Secondary | ICD-10-CM | POA: Diagnosis not present

## 2016-06-08 DIAGNOSIS — M25532 Pain in left wrist: Secondary | ICD-10-CM

## 2016-06-08 DIAGNOSIS — M65939 Unspecified synovitis and tenosynovitis, unspecified forearm: Secondary | ICD-10-CM

## 2016-06-08 MED ORDER — DICLOFENAC SODIUM 2 % TD SOLN: TRANSDERMAL | Status: DC

## 2016-06-08 NOTE — Assessment & Plan Note (Signed)
Patient does appear to have more of a partial tear. We discussed with patient about bracing, we'll start home exercises in a week, given topical anti-inflammatories and icing protocol. Patient is in a wrist immobilizer for short course. We discussed range of motion otherwise. Follow-up again in 1 week. Worsening symptoms consider x-ray.

## 2016-06-08 NOTE — Progress Notes (Signed)
[facility] [address] [place], [place] [address] Phone: [phone] Subjective:    CC: left wrist pain [id number] Stephen Lawrence is a 30 y.o. male coming in with complaint of left wrist pain. Patient's one day ago was lifting weights. He felt a popping sensation in his left wrist. States that over the course of next several hours pain seemed to get worse. Started having limitation of range of motion. Points more to the ulnar aspect of the wrist. No swelling but notices tightness in the forearm. Denies any radiation of the arm. Denies any neck pain. Rates the severity of pain as 5 out of 10. Patient was put in a brace yesterday which seems to help a little bit. Continues to have discomfort though. States though that he feels his grip strength is doing relatively well. Denies any numbness. States that it was uncomfortable at night but would not keep him up at night.     Past Medical History  Diagnosis Date  . ADD (attention deficit disorder)    Past Surgical History  Procedure Laterality Date  . Wisdom tooth extraction  2005  . Vasectomy  2016   Social History   Social History  . Marital Status: Single    Spouse Name: N/A  . Number of Children: N/A  . Years of Education: N/A   Social History Main Topics  . Smoking status: Never Smoker   . Smokeless tobacco: Not on file  . Alcohol Use: Yes  . Drug Use: No  . Sexual Activity: Yes   Other Topics Concern  . Not on file   Social History Narrative   No Known Allergies Family History  Problem Relation Age of Onset  . Meniere's disease Father     Past medical history, social, surgical and family history all reviewed in electronic medical record.  No pertanent information unless stated regarding to the chief complaint.   Review of Systems: No headache, visual changes, nausea, vomiting, diarrhea, constipation, dizziness, abdominal pain, skin rash, fevers, chills, night sweats, weight loss,  swollen lymph nodes, body aches, joint swelling, muscle aches, chest pain, shortness of breath, mood changes.   Objective There were no vitals taken for this visit.  General: No apparent distress alert and oriented x3 mood and affect normal, dressed appropriately.  HEENT: Pupils equal, extraocular movements intact  Respiratory: Patient's speak in full sentences and does not appear short of breath  Cardiovascular: No lower extremity edema, non tender, no erythema  Skin: Warm dry intact with no signs of infection or rash on extremities or on axial skeleton.  Abdomen: Soft nontender  Neuro: Cranial nerves II through XII are intact, neurovascularly intact in all extremities with 2+ DTRs and 2+ pulses.  Lymph: No lymphadenopathy of posterior or anterior cervical chain or axillae bilaterally.  Gait normal with good balance and coordination.  MSK:  Non tender with full range of motion and good stability and symmetric strength and tone of shoulders, elbows,  hip, knee and ankles bilaterally.  Wrist:left Mild swelling over the ulnar aspect just proximal to the wrist joint itself compared to the contralateral side Mild limitation in extension of the wrist secondary to pain Operation tender to palpation more on the flexor aspect or dorsal aspect of the wrist on the ulnar aspect No snuffbox tenderness. Mild pain over Guyon canal Strength 4+ out of 5 compared to the contralateral side Negative Finkelstein, tinel's and phalens. Negative Watson's test. Contralateral wrist unremarkable  MSK US performed  WU:[id number]:left wrist This study was ordered, performed, and interpreted by Terrilee FilesZach Smith D.O.  Wrist: Patient is a what appears to be a partial longitudinal tear of the flexor carpi ulnaris tendon. No significant retraction. Increasing Doppler flow and hypoechoic changes  IMPRESSION:  Partial tear of the flexor carpi ulnaris tendon      Impression and Recommendations:     This case required medical  decision making of moderate complexity.      Note: This dictation was prepared with Dragon dictation along with smaller phrase technology. Any transcriptional errors that result from this process are unintentional.

## 2016-06-08 NOTE — Patient Instructions (Signed)
Good to see you  Sorry  Partial tear of flexor carpi ulnaris.  Will get better Wear brace day and night through the weekend.  When sitting not doing anything can come out and easy range of motion.  Ice 20 minutes 2 times daily. Usually after activity and before bed. pennsaid pinkie amount topically 2 times daily as needed.  See me again soon

## 2016-06-11 ENCOUNTER — Ambulatory Visit: Payer: 59 | Admitting: Family Medicine

## 2016-06-20 ENCOUNTER — Encounter: Payer: Self-pay | Admitting: Internal Medicine

## 2016-06-22 ENCOUNTER — Other Ambulatory Visit: Payer: Self-pay | Admitting: Internal Medicine

## 2016-06-28 MED ORDER — AMPHETAMINE-DEXTROAMPHETAMINE 30 MG PO TABS: ORAL_TABLET | ORAL | 0 refills | Status: DC

## 2016-06-28 MED ORDER — AMPHETAMINE-DEXTROAMPHETAMINE 20 MG PO TABS: ORAL_TABLET | ORAL | 0 refills | Status: DC

## 2016-06-28 MED FILL — DEXTROAMP-AMPHETAMIN 20 MG: 20 | 30 days supply | Qty: 30 | Fill #0

## 2016-06-28 MED FILL — AMPHETAMINE SALTS 30 MG TAB: 30 | 30 days supply | Qty: 30 | Fill #0

## 2016-06-28 NOTE — Telephone Encounter (Signed)
Pt is requesting refill on both his Adderall. Ok to Rf?

## 2016-06-28 NOTE — Telephone Encounter (Signed)
Ok to Rf per PCP. Rxs printed/signed/given to pt.

## 2016-07-16 ENCOUNTER — Ambulatory Visit (INDEPENDENT_AMBULATORY_CARE_PROVIDER_SITE_OTHER): Payer: 59 | Admitting: Family Medicine

## 2016-07-16 ENCOUNTER — Other Ambulatory Visit: Payer: Self-pay

## 2016-07-16 ENCOUNTER — Ambulatory Visit (INDEPENDENT_AMBULATORY_CARE_PROVIDER_SITE_OTHER)
Admission: RE | Admit: 2016-07-16 | Discharge: 2016-07-16 | Disposition: A | Discharge status: Home / Self Care | Payer: 59 | Source: Ambulatory Visit | Attending: Family Medicine | Admitting: Family Medicine

## 2016-07-16 ENCOUNTER — Encounter: Payer: Self-pay | Admitting: Family Medicine

## 2016-07-16 DIAGNOSIS — M24232 Disorder of ligament, left wrist: Secondary | ICD-10-CM

## 2016-07-16 DIAGNOSIS — M25532 Pain in left wrist: Secondary | ICD-10-CM

## 2016-07-16 MED ORDER — DICLOFENAC SODIUM 2 % TD SOLN: TRANSDERMAL | 2 refills | Status: DC

## 2016-07-16 NOTE — Assessment & Plan Note (Signed)
Patient does have a wrist ligament injury a shoe. Likely secondary to more of a tear. Hypoechoic changes noted. Unable to completely specify on the ultrasound today. X-ray show no bony abnormality. Patient presented an ulnar gutter splint to try to avoid significant rotation at the moment. Patient and will come back and see me again in 2-3 weeks to make sure he is healing appropriately. Patient will take over-the-counter medications and do more of an icing protocol. Patient will come back and see me again in 2-3 weeks.

## 2016-07-16 NOTE — Progress Notes (Signed)
[name] [address] [place], [place] [address] Phone: [phone] Subjective:    CC: left wrist pain Follow-up  Stephen Lawrence  Stephen Lawrence is a 30 y.o. male coming in with complaint of left wrist pain. Patient had injury to his tendon previously on the ulnar aspect of the left wrist. Seem to be improving. Patient states though that unfortunately he was moving some boxes. Patient states that he had a sharp pain medially. Mild swelling. This happened yesterday. Still having pain especially when he pronates his wrist. Rates the severity of pain a 6 out of 10. Denies any numbness in the fingers. States that this feels fairly similar.     Past Medical History:  Diagnosis Date  . ADD (attention deficit disorder)    Past Surgical History:  Procedure Laterality Date  . VASECTOMY  2016  . WISDOM TOOTH EXTRACTION  2005   Social History   Social History  . Marital status: Single    Spouse name: N/A  . Number of children: N/A  . Years of education: N/A   Social History Main Topics  . Smoking status: Never Smoker  . Smokeless tobacco: None  . Alcohol use Yes  . Drug use: No  . Sexual activity: Yes   Other Topics Concern  . None   Social History Narrative  . None   No Known Allergies Family History  Problem Relation Age of Onset  . Meniere's disease Father     Past medical history, social, surgical and family history all reviewed in electronic medical record.  No pertanent information unless stated regarding to the chief complaint.   Review of Systems: No headache, visual changes, nausea, vomiting, diarrhea, constipation, dizziness, abdominal pain, skin rash, fevers, chills, night sweats, weight loss, swollen lymph nodes, body aches, joint swelling, muscle aches, chest pain, shortness of breath, mood changes.   Objective  There were no vitals taken for this visit.  General: No apparent distress alert and oriented x3 mood and affect  normal, dressed appropriately.  HEENT: Pupils equal, extraocular movements intact  Respiratory: Patient's speak in full sentences and does not appear short of breath  Cardiovascular: No lower extremity edema, non tender, no erythema  Skin: Warm dry intact with no signs of infection or rash on extremities or on axial skeleton.  Abdomen: Soft nontender  Neuro: Cranial nerves II through XII are intact, neurovascularly intact in all extremities with 2+ DTRs and 2+ pulses.  Lymph: No lymphadenopathy of posterior or anterior cervical chain or axillae bilaterally.  Gait normal with good balance and coordination.  MSK:  Non tender with full range of motion and good stability and symmetric strength and tone of shoulders, elbows,  hip, knee and ankles bilaterally.  Wrist:left No swelling noted today. She does foam flexion and extension but does have some pain with pronation as well as ulnar deviation. tender to palpation more on the flexor aspect or dorsal aspect of the wrist on the ulnar aspect this time though more distally. Seems to be more at the base of the ulnar and towards the hamate. No snuffbox tenderness. Continued mild pain over Guyon canal Strength 4+ out of 5 compared to the contralateral side Negative Finkelstein, tinel's and phalens. Negative Watson's test. Contralateral wrist unremarkable  MSK US performed QM:VHQI wrist This study was ordered, performed, and interpreted by Terrilee Files D.O.  Wrist: New injury. Hypoechoic changes just distal to the ulna. Seems to being between the ulna and the hamate. Questionable  ligamentous injury with hypoechoic changes and increasing Doppler flow  IMPRESSION:  Likely ligamentous injury on the ulnar/hamate area.  X-rays ordered, reviewed and interpreted by me. No bony normality noted    Impression and Recommendations:     This case required medical decision making of moderate complexity.      Note: This dictation was prepared with  Dragon dictation along with smaller phrase technology. Any transcriptional errors that result from this process are unintentional.

## 2016-07-17 ENCOUNTER — Other Ambulatory Visit: Payer: Self-pay | Admitting: *Deleted

## 2016-07-17 MED ORDER — DICLOFENAC SODIUM 2 % TD SOLN: TRANSDERMAL | 2 refills | Status: DC

## 2016-07-20 ENCOUNTER — Other Ambulatory Visit: Payer: Self-pay | Admitting: Internal Medicine

## 2016-07-24 MED ORDER — AMPHETAMINE-DEXTROAMPHETAMINE 30 MG PO TABS: ORAL_TABLET | ORAL | 0 refills | Status: DC

## 2016-07-24 MED ORDER — AMPHETAMINE-DEXTROAMPHETAMINE 20 MG PO TABS: ORAL_TABLET | ORAL | 0 refills | Status: DC

## 2016-07-24 NOTE — Telephone Encounter (Signed)
rx refilled. Pt informed

## 2016-07-26 MED FILL — DEXTROAMP-AMPHETAMIN 20 MG: 20 | 30 days supply | Qty: 30 | Fill #0

## 2016-07-26 MED FILL — DEXTROAMP-AMP 30 MG TABLET: 30 | 30 days supply | Qty: 30 | Fill #0

## 2016-07-30 ENCOUNTER — Encounter: Payer: Self-pay | Admitting: Family Medicine

## 2016-08-22 ENCOUNTER — Ambulatory Visit (INDEPENDENT_AMBULATORY_CARE_PROVIDER_SITE_OTHER)
Admission: RE | Admit: 2016-08-22 | Discharge: 2016-08-22 | Disposition: A | Discharge status: Home / Self Care | Payer: 59 | Source: Ambulatory Visit | Attending: Internal Medicine | Admitting: Internal Medicine

## 2016-08-22 ENCOUNTER — Other Ambulatory Visit: Payer: Self-pay | Admitting: Internal Medicine

## 2016-08-22 ENCOUNTER — Encounter: Payer: Self-pay | Admitting: Internal Medicine

## 2016-08-22 ENCOUNTER — Ambulatory Visit (INDEPENDENT_AMBULATORY_CARE_PROVIDER_SITE_OTHER): Payer: 59 | Admitting: Internal Medicine

## 2016-08-22 VITALS — BP 120/82 | HR 75 | Resp 16 | Wt 225.0 lb

## 2016-08-22 DIAGNOSIS — M79675 Pain in left toe(s): Secondary | ICD-10-CM | POA: Insufficient documentation

## 2016-08-22 DIAGNOSIS — S90122A Contusion of left lesser toe(s) without damage to nail, initial encounter: Secondary | ICD-10-CM | POA: Diagnosis not present

## 2016-08-22 NOTE — Progress Notes (Signed)
Pre visit review using our clinic review tool, if applicable. No additional management support is needed unless otherwise documented below in the visit note. 

## 2016-08-22 NOTE — Progress Notes (Signed)
    Subjective:    Patient ID: Stephen RiggDustin R Lawrence, male    DOB: 1986-09-02, 30 y.o.   MRN: [id number]  HPI He is here for an acute visit.   This morning he dropped a shampoo bottle on his left second toe and it instantly swelled up.  It is painful and slightly bruised.  He has pain with walking and going down stairs.  He has been taping the toe.  He denies numbness/tingling.  He wants to make sure it is not broken.   Medications and allergies reviewed with patient and updated if appropriate.  Patient Active Problem List   Diagnosis Date Noted  . Disorder of ligament of left wrist 07/16/2016  . Flexor carpi ulnaris tenosynovitis 06/08/2016  . Allergic rhinitis 05/14/2016  . Acute maxillary sinusitis 02/27/2016  . Acute eustachian tube salpingitis 02/27/2016  . Well adult exam 12/06/2015  . ADD (attention deficit disorder) 03/22/2015  . Vasectomy evaluation 03/22/2015    Current Outpatient Prescriptions on File Prior to Visit  Medication Sig Dispense Refill  . amphetamine-dextroamphetamine (ADDERALL) 20 MG tablet Take 1 q am 30 tablet 0  . amphetamine-dextroamphetamine (ADDERALL) 30 MG tablet Take after lunch 30 tablet 0  . Diclofenac Sodium 2 % SOLN Apply twice daily to affected area 1 Bottle 2   No current facility-administered medications on file prior to visit.     Past Medical History:  Diagnosis Date  . ADD (attention deficit disorder)     Past Surgical History:  Procedure Laterality Date  . VASECTOMY  2016  . WISDOM TOOTH EXTRACTION  2005    Social History   Social History  . Marital status: Single    Spouse name: N/A  . Number of children: N/A  . Years of education: N/A   Social History Main Topics  . Smoking status: Never Smoker  . Smokeless tobacco: Not on file  . Alcohol use Yes  . Drug use: No  . Sexual activity: Yes   Other Topics Concern  . Not on file   Social History Narrative  . No narrative on file    Family History  Problem Relation Age  of Onset  . Meniere's disease Father     Review of Systems  Musculoskeletal:       Second left toe pain, swelling  Skin: Positive for color change (mild bruising second left toe).  Neurological: Negative for weakness and numbness (foot or toe).       Objective:   Vitals:   08/22/16 1301  BP: 120/82  Pulse: 75  Resp: 16   Filed Weights   08/22/16 1301  Weight: 225 lb (102.1 kg)   Body mass index is 32.28 kg/m.   Physical Exam  Constitutional: He appears well-developed and well-nourished. No distress.  Cardiovascular:  Normal DP pulse left foot  Musculoskeletal:  Second toe left foot with mild swelling and ecchymosis, pain with palpation mid toe, full ROM of toe.  No pain a MTP or along metatarsal  Neurological:  Normal sensation of foot and toe on left  Skin: He is not diaphoretic.          Assessment & Plan:   See Problem List for Assessment and Plan of chronic medical problems.

## 2016-08-22 NOTE — Patient Instructions (Signed)
Have the xray today of your toe.  We will call you with the results.

## 2016-08-22 NOTE — Assessment & Plan Note (Signed)
Dropped a shampoo bottle on his second left toe today Pain, swelling, mild bruising Unlikely broken but will get an xray to evaluate

## 2016-08-28 NOTE — Telephone Encounter (Signed)
Please print script

## 2016-08-29 MED ORDER — AMPHETAMINE-DEXTROAMPHETAMINE 20 MG PO TABS: ORAL_TABLET | ORAL | 0 refills | Status: DC

## 2016-08-29 MED ORDER — AMPHETAMINE-DEXTROAMPHETAMINE 30 MG PO TABS: ORAL_TABLET | ORAL | 0 refills | Status: DC

## 2016-08-29 MED FILL — DEXTROAMP-AMPHETAMIN 20 MG: 20 | 30 days supply | Qty: 30 | Fill #0

## 2016-08-29 MED FILL — AMPHETAMINE SALTS 30 MG TAB: 30 | 30 days supply | Qty: 30 | Fill #0

## 2016-08-29 NOTE — Telephone Encounter (Signed)
Ok to Rf both per PCP. Rxs printed/signed/given to pt.

## 2016-09-04 ENCOUNTER — Ambulatory Visit: Payer: 59

## 2016-09-06 ENCOUNTER — Ambulatory Visit (INDEPENDENT_AMBULATORY_CARE_PROVIDER_SITE_OTHER): Payer: 59 | Admitting: Family Medicine

## 2016-09-06 DIAGNOSIS — M24232 Disorder of ligament, left wrist: Secondary | ICD-10-CM

## 2016-09-06 NOTE — Progress Notes (Signed)
Stephen Lawrence D.O. [facility] [address] [place], [place] [address] Phone: [phone] Subjective:    CC: left wrist pain Follow-up  OZH:YQMVHQIONGHPI:Subjective  Claiborne RiggDustin R Bellanger is a 30 y.o. male coming in with complaint of left wrist pain. Patient had injury to his tendon previously on the ulnar aspect of the left wrist. Seem to be improving. Patient states though that unfortunately he was moving some boxes. Patient states that he had a sharp pain medially. Mild swelling. This happened yesterday. Still having pain especially when he pronates his wrist. Rates the severity of pain a 6 out of 10. Denies any numbness in the fingers. States that this feels fairly similar.     Past Medical History:  Diagnosis Date  . ADD (attention deficit disorder)    Past Surgical History:  Procedure Laterality Date  . VASECTOMY  2016  . WISDOM TOOTH EXTRACTION  2005   Social History   Social History  . Marital status: Single    Spouse name: N/A  . Number of children: N/A  . Years of education: N/A   Social History Main Topics  . Smoking status: Never Smoker  . Smokeless tobacco: Not on file  . Alcohol use Yes  . Drug use: No  . Sexual activity: Yes   Other Topics Concern  . Not on file   Social History Narrative  . No narrative on file   No Known Allergies Family History  Problem Relation Age of Onset  . Meniere's disease Father     Past medical history, social, surgical and family history all reviewed in electronic medical record.  No pertanent information unless stated regarding to the chief complaint.   Review of Systems: No headache, visual changes, nausea, vomiting, diarrhea, constipation, dizziness, abdominal pain, skin rash, fevers, chills, night sweats, weight loss, swollen lymph nodes, body aches, joint swelling, muscle aches, chest pain, shortness of breath, mood changes.   Objective  There were no vitals taken for this visit.  General: No apparent distress alert and  oriented x3 mood and affect normal, dressed appropriately.  HEENT: Pupils equal, extraocular movements intact  Respiratory: Patient's speak in full sentences and does not appear short of breath  Cardiovascular: No lower extremity edema, non tender, no erythema  Skin: Warm dry intact with no signs of infection or rash on extremities or on axial skeleton.  Abdomen: Soft nontender  Neuro: Cranial nerves II through XII are intact, neurovascularly intact in all extremities with 2+ DTRs and 2+ pulses.  Lymph: No lymphadenopathy of posterior or anterior cervical chain or axillae bilaterally.  Gait normal with good balance and coordination.  MSK:  Non tender with full range of motion and good stability and symmetric strength and tone of shoulders, elbows,  hip, knee and ankles bilaterally.  Wrist:left No swelling noted today. She does foam flexion and extension but does have some pain with pronation as well as ulnar deviation. tender to palpation more on the flexor aspect or dorsal aspect of the wrist on the ulnar aspect this time though more distally. Seems to be more at the base of the ulnar and towards the hamate. No snuffbox tenderness. Continued mild pain over Guyon canal Strength 4+ out of 5 compared to the contralateral side Negative Finkelstein, tinel's and phalens. Negative Watson's test. Contralateral wrist unremarkable  MSK US performed EX:BMWUof:left wrist This study was ordered, performed, and interpreted by Terrilee FilesZach Karman Veney D.O.  Wrist: New injury. Hypoechoic changes just distal to the ulna. Seems to being  between the ulna and the hamate. Questionable ligamentous injury with hypoechoic changes and increasing Doppler flow  IMPRESSION:  Likely ligamentous injury on the ulnar/hamate area.  X-rays ordered, reviewed and interpreted by me. No bony normality noted    Impression and Recommendations:     This case required medical decision making of moderate complexity.      Note: This  dictation was prepared with Dragon dictation along with smaller phrase technology. Any transcriptional errors that result from this process are unintentional.

## 2016-09-08 NOTE — Assessment & Plan Note (Signed)
eval not performed

## 2016-09-25 ENCOUNTER — Encounter: Payer: Self-pay | Admitting: Family

## 2016-09-25 ENCOUNTER — Ambulatory Visit (INDEPENDENT_AMBULATORY_CARE_PROVIDER_SITE_OTHER): Payer: 59 | Admitting: Family

## 2016-09-25 VITALS — BP 122/70 | HR 89 | Temp 98.5°F | Resp 16 | Ht 70.0 in

## 2016-09-25 DIAGNOSIS — R6889 Other general symptoms and signs: Secondary | ICD-10-CM

## 2016-09-25 DIAGNOSIS — R531 Weakness: Secondary | ICD-10-CM

## 2016-09-25 LAB — POCT INFLUENZA A: Rapid Influenza A Ag: NEGATIVE

## 2016-09-25 MED ORDER — CEFUROXIME AXETIL 500 MG PO TABS: 500.0000 mg | ORAL_TABLET | Freq: Two times a day (BID) | ORAL | 0 refills | Status: DC

## 2016-09-25 NOTE — Progress Notes (Signed)
Subjective:    Patient ID: Stephen RiggDustin R Feinstein, male    DOB: [birth year], 30 y.o.   MRN: [id number]  Chief Complaint  Patient presents with  . Weakness    HPI:  Stephen RiggDustin R [place]ay is a 30 y.o. male who  has a past medical history of ADD (attention deficit disorder). and presents today for an acute office visit.   This is a new problem. Associated symptom of weakness, body aches and dizziness have been going on for about several hours. Started the day off without problems and after lunch started feeling symptoms. Modifying factors include Mucinex which did not help very much. No fevers. No sick contacts. Does express some dizziness as well.   No Known Allergies    Outpatient Medications Prior to Visit  Medication Sig Dispense Refill  . amphetamine-dextroamphetamine (ADDERALL) 20 MG tablet Take 1 q am 30 tablet 0  . amphetamine-dextroamphetamine (ADDERALL) 30 MG tablet Take after lunch 30 tablet 0  . Diclofenac Sodium 2 % SOLN Apply twice daily to affected area 1 Bottle 2   No facility-administered medications prior to visit.     Review of Systems  Constitutional: Positive for chills. Negative for fever.  HENT: Positive for congestion and postnasal drip.   Eyes:       Watery eyes  Respiratory: Negative for chest tightness and shortness of breath.   Neurological: Positive for dizziness and weakness.      Objective:    BP 122/70 (BP Location: Left Arm, Patient Position: Sitting, Cuff Size: Normal)   Pulse 89   Temp 98.5 F (36.9 C) (Oral)   Resp 16   Ht 5\' 10"  (1.778 m)   SpO2 96%  Nursing note and vital signs reviewed.  Physical Exam  Constitutional: He is oriented to person, place, and time. He appears well-developed and well-nourished. No distress.  HENT:  Right Ear: Hearing, tympanic membrane and external ear normal.  Left Ear: Hearing, tympanic membrane and external ear normal.  Nose: Right sinus exhibits no maxillary sinus tenderness and no frontal sinus tenderness.  Left sinus exhibits no maxillary sinus tenderness and no frontal sinus tenderness.  Mouth/Throat: Uvula is midline, oropharynx is clear and moist and mucous membranes are normal.  Bilateral ear canals appear red with no evidence of discharge or pain.   Neck: Neck supple.  Cardiovascular: Normal rate, regular rhythm, normal heart sounds and intact distal pulses.   Pulmonary/Chest: Effort normal and breath sounds normal. No respiratory distress. He has no wheezes. He has no rales. He exhibits no tenderness.  Lymphadenopathy:    He has no cervical adenopathy.  Neurological: He is alert and oriented to person, place, and time.  Skin: Skin is warm and dry.  Psychiatric: He has a normal mood and affect. His behavior is normal. Judgment and thought content normal.       Assessment & Plan:   Problem List Items Addressed This Visit      Other   Flu-like symptoms - Primary    In office flu screen negative. Symptoms are inconsistent with pneumonia or bronchitis given such early onset. Written prescription for Ceftin provided. Encouraged rest and fluids. Over the counter medications as needed for symptom relief and supportive care. Follow up if symptoms worsen or do not improve.       Relevant Orders   POCT Influenza A (Completed)       I am having Mr. Cresenciano Genreruitt start on cefUROXime. I am also having him maintain his Diclofenac Sodium, amphetamine-dextroamphetamine, and  amphetamine-dextroamphetamine.   Meds ordered this encounter  Medications  . cefUROXime (CEFTIN) 500 MG tablet    Sig: Take 1 tablet (500 mg total) by mouth 2 (two) times daily with a meal.    Dispense:  20 tablet    Refill:  0    Order Specific Question:   Supervising Provider    Answer:   Hillard DankerRAWFORD, ELIZABETH A [[id number]]     Follow-up: Return if symptoms worsen or fail to improve.  Jeanine Luzalone, Javona Bergevin, FNP

## 2016-09-25 NOTE — Assessment & Plan Note (Signed)
In office flu screen negative. Symptoms are inconsistent with pneumonia or bronchitis given such early onset. Written prescription for Ceftin provided. Encouraged rest and fluids. Over the counter medications as needed for symptom relief and supportive care. Follow up if symptoms worsen or do not improve.

## 2016-09-25 NOTE — Patient Instructions (Signed)
Thank you for choosing [facility].  SUMMARY AND INSTRUCTIONS:  Medication:  Your prescription(s) have been submitted to your pharmacy or been printed and provided for you. Please take as directed and contact our office if you believe you are having problem(s) with the medication(s) or have any questions.  General Recommendations:    Please drink plenty of fluids.  Get plenty of rest   Sleep in humidified air  Use saline nasal sprays  Netti pot   OTC Medications:  Decongestants - helps relieve congestion   Flonase (generic fluticasone) or Nasacort (generic triamcinolone) - please make sure to use the "cross-over" technique at a 45 degree angle towards the opposite eye as opposed to straight up the nasal passageway.   Sudafed (generic pseudoephedrine - Note this is the one that is available behind the pharmacy counter); Products with phenylephrine (-PE) may also be used but is often not as effective as pseudoephedrine.   If you have HIGH BLOOD PRESSURE - Coricidin HBP; AVOID any product that is -D as this contains pseudoephedrine which may increase your blood pressure.  Afrin (oxymetazoline) every 6-8 hours for up to 3 days.   Allergies - helps relieve runny nose, itchy eyes and sneezing   Claritin (generic loratidine), Allegra (fexofenidine), or Zyrtec (generic cyrterizine) for runny nose. These medications should not cause drowsiness.  Note - Benadryl (generic diphenhydramine) may be used however may cause drowsiness  Cough -   Delsym or Robitussin (generic dextromethorphan)  Expectorants - helps loosen mucus to ease removal   Mucinex (generic guaifenesin) as directed on the package.  Headaches / General Aches   Tylenol (generic acetaminophen) - DO NOT EXCEED 3 grams (3,000 mg) in a 24 hour time period  Advil/Motrin (generic ibuprofen)   Sore Throat -   Salt water gargle   Chloraseptic (generic benzocaine) spray or lozenges / Sucrets (generic  dyclonine)

## 2016-10-01 ENCOUNTER — Other Ambulatory Visit: Payer: Self-pay | Admitting: Internal Medicine

## 2016-10-02 MED ORDER — AMPHETAMINE-DEXTROAMPHETAMINE 30 MG PO TABS: ORAL_TABLET | ORAL | 0 refills | Status: DC

## 2016-10-02 MED ORDER — AMPHETAMINE-DEXTROAMPHETAMINE 20 MG PO TABS: ORAL_TABLET | ORAL | 0 refills | Status: DC

## 2016-10-02 MED FILL — DEXTROAMP-AMPHETAMIN 20 MG: 20 | 30 days supply | Qty: 30 | Fill #0

## 2016-10-02 MED FILL — AMPHETAMINE SALTS 30 MG TAB: 30 | 30 days supply | Qty: 30 | Fill #0

## 2016-10-25 ENCOUNTER — Telehealth: Payer: 59 | Admitting: Family

## 2016-10-25 DIAGNOSIS — R05 Cough: Secondary | ICD-10-CM

## 2016-10-25 DIAGNOSIS — J209 Acute bronchitis, unspecified: Secondary | ICD-10-CM

## 2016-10-25 DIAGNOSIS — R059 Cough, unspecified: Secondary | ICD-10-CM

## 2016-10-25 MED ORDER — PREDNISONE 10 MG (21) PO TBPK: 10.0000 mg | ORAL_TABLET | Freq: Every day | ORAL | 0 refills | Status: DC

## 2016-10-25 MED ORDER — BENZONATATE 100 MG PO CAPS: 100.0000 mg | ORAL_CAPSULE | Freq: Two times a day (BID) | ORAL | 0 refills | Status: DC | PRN

## 2016-10-25 NOTE — Progress Notes (Signed)
We are sorry that you are not feeling well.  Here is how we plan to help!  Based on what you have shared with me it looks like you have upper respiratory tract inflammation that has resulted in a significant cough.  Inflammation and infection in the upper respiratory tract is commonly called bronchitis and has four common causes:  Allergies, Viral Infections, Acid Reflux and Bacterial Infections.  Allergies, viruses and acid reflux are treated by controlling symptoms or eliminating the cause. An example might be a cough caused by taking certain blood pressure medications. You stop the cough by changing the medication. Another example might be a cough caused by acid reflux. Controlling the reflux helps control the cough.  Based on your presentation I believe you most likely have A cough due to a virus.  This is called viral bronchitis and is best treated by rest, plenty of fluids and control of the cough.  You may use Ibuprofen or Tylenol as directed to help your symptoms.     In addition you may use A prescription cough medication called Tessalon Perles 100mg. You may take 1-2 capsules every 8 hours as needed for your cough.  Sterapred 10 mg dosepak  USE OF BRONCHODILATOR ("RESCUE") INHALERS: There is a risk from using your bronchodilator too frequently.  The risk is that over-reliance on a medication which only relaxes the muscles surrounding the breathing tubes can reduce the effectiveness of medications prescribed to reduce swelling and congestion of the tubes themselves.  Although you feel brief relief from the bronchodilator inhaler, your asthma may actually be worsening with the tubes becoming more swollen and filled with mucus.  This can delay other crucial treatments, such as oral steroid medications. If you need to use a bronchodilator inhaler daily, several times per day, you should discuss this with your provider.  There are probably better treatments that could be used to keep your asthma  under control.     HOME CARE . Only take medications as instructed by your medical team. . Complete the entire course of an antibiotic. . Drink plenty of fluids and get plenty of rest. . Avoid close contacts especially the very young and the elderly . Cover your mouth if you cough or cough into your sleeve. . Always remember to wash your hands . A steam or ultrasonic humidifier can help congestion.   GET HELP RIGHT AWAY IF: . You develop worsening fever. . You become short of breath . You cough up blood. . Your symptoms persist after you have completed your treatment plan MAKE SURE YOU   Understand these instructions.  Will watch your condition.  Will get help right away if you are not doing well or get worse.  Your e-visit answers were reviewed by a board certified advanced clinical practitioner to complete your personal care plan.  Depending on the condition, your plan could have included both over the counter or prescription medications. If there is a problem please reply  once you have received a response from your provider. Your safety is important to us.  If you have drug allergies check your prescription carefully.    You can use MyChart to ask questions about today's visit, request a non-urgent call back, or ask for a work or school excuse for 24 hours related to this e-Visit. If it has been greater than 24 hours you will need to follow up with your provider, or enter a new e-Visit to address those concerns. You will get an e-mail   in the next two days asking about your experience.  I hope that your e-visit has been valuable and will speed your recovery. Thank you for using e-visits.   

## 2016-10-29 ENCOUNTER — Other Ambulatory Visit: Payer: Self-pay | Admitting: Internal Medicine

## 2016-11-05 MED ORDER — AMPHETAMINE-DEXTROAMPHETAMINE 20 MG PO TABS: ORAL_TABLET | ORAL | 0 refills | Status: DC

## 2016-11-05 MED ORDER — AMPHETAMINE-DEXTROAMPHETAMINE 30 MG PO TABS: ORAL_TABLET | ORAL | 0 refills | Status: DC

## 2016-11-05 MED FILL — AMPHETAMINE SALTS 30 MG TAB: 30 | 30 days supply | Qty: 30 | Fill #0

## 2016-11-05 MED FILL — DEXTROAMP-AMPHETAMIN 20 MG: 20 | 30 days supply | Qty: 30 | Fill #0

## 2016-11-05 NOTE — Telephone Encounter (Signed)
Ok to Rf per PCP. Done see meds. Rx printed/signed/given to pt.

## 2016-11-21 ENCOUNTER — Other Ambulatory Visit: Payer: Self-pay | Admitting: Internal Medicine

## 2016-12-03 ENCOUNTER — Other Ambulatory Visit: Payer: Self-pay | Admitting: Internal Medicine

## 2016-12-03 MED ORDER — AMPHETAMINE-DEXTROAMPHETAMINE 30 MG PO TABS: ORAL_TABLET | ORAL | 0 refills | Status: DC

## 2016-12-03 MED ORDER — AMPHETAMINE-DEXTROAMPHETAMINE 20 MG PO TABS: ORAL_TABLET | ORAL | 0 refills | Status: DC
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
## 2016-12-21 DIAGNOSIS — N4341 Spermatocele of epididymis, single: Secondary | ICD-10-CM | POA: Diagnosis not present

## 2017-01-03 ENCOUNTER — Other Ambulatory Visit: Payer: Self-pay | Admitting: Internal Medicine

## 2017-01-08 ENCOUNTER — Other Ambulatory Visit: Payer: Self-pay

## 2017-01-08 MED ORDER — AMPHETAMINE-DEXTROAMPHETAMINE 20 MG PO TABS: ORAL_TABLET | ORAL | 0 refills | Status: DC

## 2017-01-08 MED ORDER — AMPHETAMINE-DEXTROAMPHETAMINE 30 MG PO TABS: ORAL_TABLET | ORAL | 0 refills | Status: DC

## 2017-01-08 MED FILL — DEXTROAMP-AMP 30 MG TABLET: 30 | 30 days supply | Qty: 30 | Fill #0

## 2017-01-08 MED FILL — AMPHETAMINE SALTS 20 MG TAB: 20 | 30 days supply | Qty: 30 | Fill #0

## 2017-01-08 NOTE — Telephone Encounter (Signed)
rx for 20mg  and 30mg  adderall faxed to [facility]

## 2017-02-06 ENCOUNTER — Encounter: Payer: Self-pay | Admitting: Internal Medicine

## 2017-02-20 ENCOUNTER — Ambulatory Visit (INDEPENDENT_AMBULATORY_CARE_PROVIDER_SITE_OTHER): Payer: 59 | Admitting: Family Medicine

## 2017-02-20 ENCOUNTER — Encounter: Payer: Self-pay | Admitting: Family Medicine

## 2017-02-20 VITALS — BP 126/74 | HR 73 | Temp 97.9°F | Ht 70.0 in | Wt 231.8 lb

## 2017-02-20 DIAGNOSIS — Z13220 Encounter for screening for lipoid disorders: Secondary | ICD-10-CM | POA: Diagnosis not present

## 2017-02-20 DIAGNOSIS — E6609 Other obesity due to excess calories: Secondary | ICD-10-CM

## 2017-02-20 DIAGNOSIS — Z6833 Body mass index (BMI) 33.0-33.9, adult: Secondary | ICD-10-CM | POA: Diagnosis not present

## 2017-02-20 DIAGNOSIS — R5383 Other fatigue: Secondary | ICD-10-CM | POA: Diagnosis not present

## 2017-02-20 DIAGNOSIS — G478 Other sleep disorders: Secondary | ICD-10-CM

## 2017-02-20 DIAGNOSIS — F902 Attention-deficit hyperactivity disorder, combined type: Secondary | ICD-10-CM

## 2017-02-20 DIAGNOSIS — E559 Vitamin D deficiency, unspecified: Secondary | ICD-10-CM | POA: Diagnosis not present

## 2017-02-20 DIAGNOSIS — R7989 Other specified abnormal findings of blood chemistry: Secondary | ICD-10-CM

## 2017-02-20 NOTE — Progress Notes (Signed)
Pre visit review using our clinic review tool, if applicable. No additional management support is needed unless otherwise documented below in the visit note. 

## 2017-02-20 NOTE — Progress Notes (Signed)
Stephen Lawrence is a 31 y.o. male is here to [facility].   History of Present Illness:  Insurance claims handler, CMA, acting as scribe for Dr. Earlene Lawrence.  HPI:  1. Attention deficit hyperactivity disorder (ADHD), combined type. Controlled on Adderall for years. Patient would like to try functioning off of the medication for a while to see how he does. He is taking vitamins to help with the transition. So far, he has remained off for a few weeks and done well.    2. Class 1 obesity. Comfortable weight is around 205. He is slightly higher at this time. He exercises nearly daily. Drinks adequate water. Struggles with food - can eat anything in sight. Endorses binge eating. States that he will sometimes eat after going to sleep if he falls asleep on the sofa and will not remember eating the next day.   There are no preventive care reminders to display for this patient.  PMHx, SurgHx, SocialHx, Medications, and Allergies were reviewed in the Visit Navigator and updated as appropriate.   Past Medical History:  Diagnosis Date  . ADD (attention deficit disorder)   . Class 1 obesity due to excess calories without serious comorbidity with body mass index (BMI) of 33.0 to 33.9 in adult 02/23/2017  . Low vitamin D level 02/23/2017   Past Surgical History:  Procedure Laterality Date  . VASECTOMY  2016  . WISDOM TOOTH EXTRACTION  2005   Family History  Problem Relation Age of Onset  . Meniere's disease Father    Social History  Substance Use Topics  . Smoking status: Never Smoker  . Smokeless tobacco: Never Used  . Alcohol use Yes     Comment: Socially   Current Medications and Allergies:    .  Diclofenac Sodium 2 % SOLN, Apply twice daily to affected area, Disp: 1 Bottle, Rfl: 2  No Known Allergies   Review of Systems:   Review of Systems  Constitutional: Negative for chills and fever.  HENT: Negative for congestion and sore throat.   Eyes: Negative for blurred vision.  Respiratory:  Negative for cough.   Cardiovascular: Negative for chest pain and palpitations.  Gastrointestinal: Negative for abdominal pain, nausea and vomiting.  Genitourinary: Negative for frequency.  Musculoskeletal: Negative for back pain.  Skin: Negative for rash.  Neurological: Negative for loss of consciousness and headaches.  Psychiatric/Behavioral: Negative for depression.    Vitals:   Vitals:   02/20/17 1302  BP: 126/74  Pulse: 73  Temp: 97.9 F (36.6 C)  TempSrc: Oral  SpO2: 97%  Weight: 231 lb 12.8 oz (105.1 kg)  Height:  (1.778 m)     Body mass index is 33.26 kg/m.  Physical Exam:   Physical Exam  Constitutional: He is oriented to person, place, and time. He appears well-developed and well-nourished. No distress.  HENT:  Head: Normocephalic and atraumatic.  Right Ear: External ear normal.  Left Ear: External ear normal.  Nose: Nose normal.  Mouth/Throat: Oropharynx is clear and moist.  Eyes: Conjunctivae and EOM are normal. Pupils are equal, round, and reactive to light.  Neck: Normal range of motion. Neck supple.  Cardiovascular: Normal rate, regular rhythm, normal heart sounds and intact distal pulses.   Pulmonary/Chest: Effort normal and breath sounds normal.  Abdominal: Soft. Bowel sounds are normal.  Musculoskeletal: Normal range of motion.  Neurological: He is alert and oriented to person, place, and time.  Skin: Skin is warm and dry.  Psychiatric: He has a normal  mood and affect. His behavior is normal. Judgment and thought content normal.  Nursing note and vitals reviewed.  Assessment and Plan:    Stephen Lawrence was seen today for establish care.  Diagnoses and all orders for this visit:  Attention deficit hyperactivity disorder (ADHD), combined type Comments: Patient would like to hold off on medications at this time. I would recommend trial of Vyvanse if he wants to restart.  Fatigue, unspecified type -     Comprehensive metabolic panel; Future -      TSH; Future -     CBC with Differential/Platelet; Future -     Vitamin B12; Future -     Testosterone Total,Free,Bio, Males; Future -     Insulin, Free (Bioactive); Future  Screening for lipid disorders -     Lipid panel; Future  Low vitamin D level Comments: Will treat. Orders: -     VITAMIN D 25 Hydroxy (Vit-D Deficiency, Fractures); Future -     Cholecalciferol 50000 units TABS; 50,000 units PO qwk for 8 weeks.  Class 1 obesity due to excess calories without serious comorbidity with body mass index (BMI) of 33.0 to 33.9 in adult Comments: He is exercising most days. Hydrating well. Working on diet.  Sleep related eating disorder Comments: Will review labs once available. Will look into therapeutic options for patient.    . Reviewed expectations re: course of current medical issues. . Discussed self-management of symptoms. . Outlined signs and symptoms indicating need for more acute intervention. . Patient verbalized understanding and all questions were answered. . See orders for this visit as documented in the electronic medical record. . Patient received an After Visit Summary.  Records requested if needed. I spent 20 minutes with this patient, greater than 50% was face-to-face time counseling regarding the above diagnoses.  Stephen Lawrence, D.O. [place] 02/24/2017   Follow-up: No Follow-up on file.  Meds ordered this encounter  Medications  . Cholecalciferol 50000 units TABS    Sig: 50,000 units PO qwk for 8 weeks.    Dispense:  12 tablet    Refill:  0   Medications Discontinued During This Encounter  Medication Reason  . amphetamine-dextroamphetamine (ADDERALL) 20 MG tablet Error  . amphetamine-dextroamphetamine (ADDERALL) 30 MG tablet Error  . benzonatate (TESSALON) 100 MG capsule Error  . cefUROXime (CEFTIN) 500 MG tablet Error  . predniSONE (STERAPRED UNI-PAK 21 TAB) 10 MG (21) TBPK tablet Error   Orders Placed This Encounter    Procedures  . Comprehensive metabolic panel  . Lipid panel  . TSH  . CBC with Differential/Platelet  . Vitamin B12  . VITAMIN D 25 Hydroxy (Vit-D Deficiency, Fractures)  . Testosterone Total,Free,Bio, Males  . Insulin, Free (Bioactive)

## 2017-02-21 ENCOUNTER — Other Ambulatory Visit (INDEPENDENT_AMBULATORY_CARE_PROVIDER_SITE_OTHER): Payer: 59

## 2017-02-21 DIAGNOSIS — R5383 Other fatigue: Secondary | ICD-10-CM | POA: Diagnosis not present

## 2017-02-21 DIAGNOSIS — E559 Vitamin D deficiency, unspecified: Secondary | ICD-10-CM

## 2017-02-21 DIAGNOSIS — R7989 Other specified abnormal findings of blood chemistry: Secondary | ICD-10-CM

## 2017-02-21 DIAGNOSIS — Z13220 Encounter for screening for lipoid disorders: Secondary | ICD-10-CM | POA: Diagnosis not present

## 2017-02-21 LAB — LIPID PANEL
Cholesterol: 164 mg/dL (ref 0–200)
HDL: 48.2 mg/dL (ref >39.00)
LDL Cholesterol: 104 mg/dL — ABNORMAL HIGH (ref 0–99)
NonHDL: 116.11
Total CHOL/HDL Ratio: 3
Triglycerides: 61 mg/dL (ref 0.0–149.0)
VLDL: 12.2 mg/dL (ref 0.0–40.0)

## 2017-02-21 LAB — COMPREHENSIVE METABOLIC PANEL
ALT: 39 U/L (ref 0–53)
AST: 37 U/L (ref 0–37)
Albumin: 4.3 g/dL (ref 3.5–5.2)
Alkaline Phosphatase: 61 U/L (ref 39–117)
BUN: 16 mg/dL (ref 6–23)
CO2: 30 mEq/L (ref 19–32)
Calcium: 9.6 mg/dL (ref 8.4–10.5)
Chloride: 106 mEq/L (ref 96–112)
Creatinine, Ser: 0.93 mg/dL (ref 0.40–1.50)
GFR: 102.14 mL/min (ref >60.00)
Glucose, Bld: 90 mg/dL (ref 70–99)
Potassium: 4.5 mEq/L (ref 3.5–5.1)
Sodium: 142 mEq/L (ref 135–145)
Total Bilirubin: 0.3 mg/dL (ref 0.2–1.2)
Total Protein: 6.5 g/dL (ref 6.0–8.3)

## 2017-02-21 LAB — VITAMIN B12: Vitamin B-12: 339 pg/mL (ref 211–911)

## 2017-02-21 LAB — CBC WITH DIFFERENTIAL/PLATELET
Basophils Absolute: 0 10*3/uL (ref 0.0–0.1)
Basophils Relative: 0.5 % (ref 0.0–3.0)
Eosinophils Absolute: 0.1 10*3/uL (ref 0.0–0.7)
Eosinophils Relative: 1.7 % (ref 0.0–5.0)
HCT: 43.3 % (ref 39.0–52.0)
Hemoglobin: 14.9 g/dL (ref 13.0–17.0)
Lymphocytes Relative: 26.3 % (ref 12.0–46.0)
Lymphs Abs: 1.6 10*3/uL (ref 0.7–4.0)
MCHC: 34.4 g/dL (ref 30.0–36.0)
MCV: 86.9 fl (ref 78.0–100.0)
Monocytes Absolute: 0.6 10*3/uL (ref 0.1–1.0)
Monocytes Relative: 9.5 % (ref 3.0–12.0)
Neutro Abs: 3.7 10*3/uL (ref 1.4–7.7)
Neutrophils Relative %: 62 % (ref 43.0–77.0)
Platelets: 288 10*3/uL (ref 150.0–400.0)
RBC: 4.99 Mil/uL (ref 4.22–5.81)
RDW: 13.5 % (ref 11.5–15.5)
WBC: 6 10*3/uL (ref 4.0–10.5)

## 2017-02-21 LAB — VITAMIN D 25 HYDROXY (VIT D DEFICIENCY, FRACTURES): VITD: 16.86 ng/mL — ABNORMAL LOW (ref 30.00–100.00)

## 2017-02-21 LAB — TSH: TSH: 1.78 u[IU]/mL (ref 0.35–4.50)

## 2017-02-22 LAB — TESTOSTERONE TOTAL,FREE,BIO, MALES
Albumin: 4.1 g/dL (ref 3.6–5.1)
Sex Hormone Binding: 16 nmol/L (ref 10–50)
Testosterone, Bioavailable: 137.5 ng/dL (ref 110.0–575.0)
Testosterone, Free: 73 pg/mL (ref 46.0–224.0)
Testosterone: 324 ng/dL (ref 250–827)

## 2017-02-23 DIAGNOSIS — Z6833 Body mass index (BMI) 33.0-33.9, adult: Secondary | ICD-10-CM

## 2017-02-23 DIAGNOSIS — R7989 Other specified abnormal findings of blood chemistry: Secondary | ICD-10-CM

## 2017-02-23 DIAGNOSIS — E6609 Other obesity due to excess calories: Secondary | ICD-10-CM

## 2017-02-23 HISTORY — DX: Other specified abnormal findings of blood chemistry: R79.89

## 2017-02-23 HISTORY — DX: Other obesity due to excess calories: E66.09

## 2017-02-24 ENCOUNTER — Encounter: Payer: Self-pay | Admitting: Family Medicine

## 2017-02-24 MED ORDER — CHOLECALCIFEROL 1.25 MG (50000 UT) PO TABS: ORAL_TABLET | ORAL | 0 refills | Status: DC

## 2017-02-25 ENCOUNTER — Other Ambulatory Visit: Payer: Self-pay | Admitting: Surgical

## 2017-02-25 MED FILL — VITAMIN D3 50000 UNIT CAPS: 1.25 MG | 56 days supply | Qty: 12 | Fill #0

## 2017-02-26 LAB — INSULIN, FREE (BIOACTIVE): Insulin, Free: 7.1 u[IU]/mL (ref 1.5–14.9)

## 2017-03-05 MED ORDER — LISDEXAMFETAMINE DIMESYLATE 30 MG PO CAPS: 30.0000 mg | ORAL_CAPSULE | Freq: Every day | ORAL | 0 refills | Status: DC

## 2017-03-05 NOTE — Addendum Note (Signed)
Addended by: Helane Rima R on: 03/05/2017 03:53 PM   Modules accepted: Orders

## 2017-03-11 ENCOUNTER — Ambulatory Visit (INDEPENDENT_AMBULATORY_CARE_PROVIDER_SITE_OTHER): Payer: 59 | Admitting: Psychology

## 2017-03-11 DIAGNOSIS — F411 Generalized anxiety disorder: Secondary | ICD-10-CM | POA: Diagnosis not present

## 2017-03-14 ENCOUNTER — Other Ambulatory Visit: Payer: Self-pay

## 2017-03-14 MED ORDER — AMPHETAMINE-DEXTROAMPHETAMINE 10 MG PO TABS: ORAL_TABLET | ORAL | 0 refills | Status: DC

## 2017-03-14 MED FILL — DEXTROAMP-AMP 10 MG TAB: 10 | 30 days supply | Qty: 90 | Fill #0

## 2017-03-15 ENCOUNTER — Telehealth: Payer: Self-pay

## 2017-03-15 NOTE — Telephone Encounter (Signed)
PA for patient's Adderall has been initiated through CoverMyMeds.  Waiting for decision.

## 2017-03-20 NOTE — Telephone Encounter (Signed)
PA approved through 03/13/2018.

## 2017-04-02 ENCOUNTER — Telehealth: Payer: Self-pay | Admitting: Physician Assistant

## 2017-04-02 ENCOUNTER — Ambulatory Visit: Payer: Self-pay | Admitting: Physician Assistant

## 2017-04-02 ENCOUNTER — Other Ambulatory Visit (INDEPENDENT_AMBULATORY_CARE_PROVIDER_SITE_OTHER): Payer: 59 | Admitting: Physician Assistant

## 2017-04-02 DIAGNOSIS — R05 Cough: Secondary | ICD-10-CM | POA: Diagnosis not present

## 2017-04-02 DIAGNOSIS — R059 Cough, unspecified: Secondary | ICD-10-CM

## 2017-04-02 MED ORDER — ALBUTEROL SULFATE HFA 108 (90 BASE) MCG/ACT IN AERS: 2.0000 | INHALATION_SPRAY | Freq: Four times a day (QID) | RESPIRATORY_TRACT | 0 refills | Status: DC | PRN

## 2017-04-02 MED ORDER — IPRATROPIUM-ALBUTEROL 0.5-2.5 (3) MG/3ML IN SOLN
3.0000 mL | Freq: Once | RESPIRATORY_TRACT | Status: AC
  Administered 2017-04-02: 3 mL via RESPIRATORY_TRACT

## 2017-04-02 MED ORDER — HYDROCOD POLST-CPM POLST ER 10-8 MG/5ML PO SUER: 5.0000 mL | Freq: Every evening | ORAL | 0 refills | Status: DC | PRN

## 2017-04-02 MED FILL — HYDROCODONE-CHLORPHENIRAM S: 10-8 | 12 days supply | Qty: 60 | Fill #0

## 2017-04-02 NOTE — Telephone Encounter (Signed)
Patient received DuoNeb in office due to congested cough. Lungs clear before and after treatment.Tolerated well. Advised patient to make an appointment if he developed any fever, worsening cough, chills, or any other concerns. I will prescribe Tussionex to help with cough as well as albuterol inhaler prn.

## 2017-04-11 ENCOUNTER — Other Ambulatory Visit: Payer: Self-pay | Admitting: Surgical

## 2017-04-11 MED ORDER — AMPHETAMINE-DEXTROAMPHETAMINE 10 MG PO TABS: ORAL_TABLET | ORAL | 0 refills | Status: DC

## 2017-04-11 MED FILL — DEXTROAMP-AMP 10 MG TAB: 10 | 30 days supply | Qty: 90 | Fill #0

## 2017-04-11 NOTE — Addendum Note (Signed)
Addended by: Dorian PodWHEELEY, JAMIE J on: 04/11/2017 10:22 AM   Modules accepted: Orders

## 2017-04-11 NOTE — Telephone Encounter (Signed)
Can we this medication?

## 2017-04-23 ENCOUNTER — Other Ambulatory Visit: Payer: Self-pay | Admitting: Surgical

## 2017-04-23 MED ORDER — ERYTHROMYCIN 5 MG/GM OP OINT: 1.0000 "application " | TOPICAL_OINTMENT | Freq: Three times a day (TID) | OPHTHALMIC | 0 refills | Status: DC

## 2017-04-23 NOTE — Progress Notes (Signed)
Per Dr. Earlene PlaterWallace sent erythromycin to pharmacy for patient.

## 2017-05-13 ENCOUNTER — Ambulatory Visit (INDEPENDENT_AMBULATORY_CARE_PROVIDER_SITE_OTHER): Payer: 59 | Admitting: Family Medicine

## 2017-05-13 ENCOUNTER — Encounter: Payer: Self-pay | Admitting: Family Medicine

## 2017-05-13 VITALS — BP 116/80 | HR 63 | Temp 98.4°F | Wt 234.0 lb

## 2017-05-13 DIAGNOSIS — L989 Disorder of the skin and subcutaneous tissue, unspecified: Secondary | ICD-10-CM

## 2017-05-13 MED FILL — DEXTROAMP-AMP 10 MG TAB: 10 | 30 days supply | Qty: 90 | Fill #0

## 2017-05-13 NOTE — Progress Notes (Signed)
Stephen Lawrence is a 31 y.o. male here for an acute visit.  History of Present Illness:   Stephen Lawrence CMA acting as scribe for Dr. Earlene Plater.   HPI: Patient presents today with complaints of birth marking rasing on skin in the last 2 weeks.   PMHx, SurgHx, SocialHx, Medications, and Allergies were reviewed in the Visit Navigator and updated as appropriate.  Current Medications:   Current Outpatient Prescriptions:  .  albuterol (PROVENTIL HFA;VENTOLIN HFA) 108 (90 Base) MCG/ACT inhaler, Inhale 2 puffs into the lungs every 6 (six) hours as needed for wheezing or shortness of breath., Disp: 1 Inhaler, Rfl: 0 .  amphetamine-dextroamphetamine (ADDERALL) 10 MG tablet, Take 2 tablets in the morning and 1 tablet in the afternoon, Disp: 90 tablet, Rfl: 0 .  [START ON 06/11/2017] amphetamine-dextroamphetamine (ADDERALL) 10 MG tablet, Take 2 tablets in the morning and 1 tablet in th afternoon., Disp: 90 tablet, Rfl: 0 .  Ascorbic Acid (VITAMIN C) 1000 MG tablet, Take 1,000 mg by mouth daily., Disp: , Rfl:  .  Cholecalciferol 50000 units TABS, 50,000 units PO qwk for 8 weeks., Disp: 12 tablet, Rfl: 0 .  Diclofenac Sodium 2 % SOLN, Apply twice daily to affected area, Disp: 1 Bottle, Rfl: 2 .  erythromycin (ROMYCIN) ophthalmic ointment, Place 1 application into the left eye 3 (three) times daily., Disp: 3.5 g, Rfl: 0 .  GINSENG EXTRACT PO, Take by mouth., Disp: , Rfl:  .  magnesium 30 MG tablet, Take 30 mg by mouth 2 (two) times daily., Disp: , Rfl:  .  Multiple Vitamins-Minerals (ZINC PO), Take by mouth., Disp: , Rfl:  .  Omega-3 Fatty Acids (FISH OIL) 1000 MG CAPS, Take by mouth., Disp: , Rfl:  .  [employer] JOH[place] WORT PO, Take by mouth., Disp: , Rfl:    No Known Allergies   Review of Systems:   ROS per hPI.  Vitals:   Vitals:   05/13/17 0953  BP: 116/80  Pulse: 63  Temp: 98.4 F (36.9 C)  TempSrc: Oral  SpO2: 97%  Weight: 234 lb (106.1 kg)     Body mass index is 33.58 kg/m.  Physical  Exam:   Physical Exam  HENT:  Head:     Results for orders placed or performed in visit on 02/21/17  Comprehensive metabolic panel  Result Value Ref Range   Sodium 142 135 - 145 mEq/L   Potassium 4.5 3.5 - 5.1 mEq/L   Chloride 106 96 - 112 mEq/L   CO2 30 19 - 32 mEq/L   Glucose, Bld 90 70 - 99 mg/dL   BUN 16 6 - 23 mg/dL   Creatinine, Ser 6.96 0.40 - 1.50 mg/dL   Total Bilirubin 0.3 0.2 - 1.2 mg/dL   Alkaline Phosphatase 61 39 - 117 U/L   AST 37 0 - 37 U/L   ALT 39 0 - 53 U/L   Total Protein 6.5 6.0 - 8.3 g/dL   Albumin 4.3 3.5 - 5.2 g/dL   Calcium 9.6 8.4 - 29.5 mg/dL   GFR 284.13 >24.40 mL/min  Lipid panel  Result Value Ref Range   Cholesterol 164 0 - 200 mg/dL   Triglycerides 10.2 0.0 - 149.0 mg/dL   HDL 72.53 >66.44 mg/dL   VLDL 03.4 0.0 - 74.2 mg/dL   LDL Cholesterol 595 (H) 0 - 99 mg/dL   Total CHOL/HDL Ratio 3    NonHDL 116.11   TSH  Result Value Ref Range   TSH 1.78 0.35 -  4.50 uIU/mL  CBC with Differential/Platelet  Result Value Ref Range   WBC 6.0 4.0 - 10.5 K/uL   RBC 4.99 4.22 - 5.81 Mil/uL   Hemoglobin 14.9 13.0 - 17.0 g/dL   HCT 16.143.3 09.639.0 - 04.552.0 %   MCV 86.9 78.0 - 100.0 fl   MCHC 34.4 30.0 - 36.0 g/dL   RDW 40.913.5 81.111.5 - 91.415.5 %   Platelets 288.0 150.0 - 400.0 K/uL   Neutrophils Relative % 62.0 43.0 - 77.0 %   Lymphocytes Relative 26.3 12.0 - 46.0 %   Monocytes Relative 9.5 3.0 - 12.0 %   Eosinophils Relative 1.7 0.0 - 5.0 %   Basophils Relative 0.5 0.0 - 3.0 %   Neutro Abs 3.7 1.4 - 7.7 K/uL   Lymphs Abs 1.6 0.7 - 4.0 K/uL   Monocytes Absolute 0.6 0.1 - 1.0 K/uL   Eosinophils Absolute 0.1 0.0 - 0.7 K/uL   Basophils Absolute 0.0 0.0 - 0.1 K/uL  Vitamin B12  Result Value Ref Range   Vitamin B-12 339 211 - 911 pg/mL  VITAMIN D 25 Hydroxy (Vit-D Deficiency, Fractures)  Result Value Ref Range   VITD 16.86 (L) 30.00 - 100.00 ng/mL  Testosterone Total,Free,Bio, Males  Result Value Ref Range   Testosterone 324 250 - 827 ng/dL   Albumin 4.1 3.6 -  5.1 g/dL   Sex Hormone Binding 16 10 - 50 nmol/L   Testosterone, Free 73.0 46.0 - 224.0 pg/mL   Testosterone, Bioavailable 137.5 110.0 - 575.0 ng/dL  Insulin, Free (Bioactive)  Result Value Ref Range   Insulin, Free 7.1 1.5 - 14.9 uIU/mL    Assessment and Plan:   Stephen Lawrence was seen today for nevus.  Diagnoses and all orders for this visit:  Scalp lesion -     Ambulatory referral to Dermatology   . Reviewed expectations re: course of current medical issues. . Discussed self-management of symptoms. . Outlined signs and symptoms indicating need for more acute intervention. . Patient verbalized understanding and all questions were answered. Marland Kitchen. Health Maintenance issues including appropriate healthy diet, exercise, and smoking avoidance were discussed with patient. . See orders for this visit as documented in the electronic medical record. . Patient received an After Visit Summary.  CMA served as Neurosurgeonscribe during this visit. History, Physical, and Plan performed by medical provider. The above documentation has been reviewed and is accurate and complete. Helane RimaErica Wallace, D.O.  Helane RimaErica Wallace, DO [place] [place] 05/13/2017  No future appointments.

## 2017-06-13 MED FILL — DEXTROAMP-AMP 10 MG TAB: 10 | 30 days supply | Qty: 90 | Fill #0

## 2017-07-09 ENCOUNTER — Other Ambulatory Visit: Payer: Self-pay | Admitting: Surgical

## 2017-07-09 MED ORDER — AMPHETAMINE-DEXTROAMPHETAMINE 10 MG PO TABS: ORAL_TABLET | ORAL | 0 refills | Status: DC

## 2017-07-09 NOTE — Progress Notes (Signed)
RX printed and Dr signed. Handed to patient.

## 2017-07-11 MED FILL — DEXTROAMP-AMP 10 MG TAB: 10 | 30 days supply | Qty: 90 | Fill #0

## 2017-10-14 ENCOUNTER — Other Ambulatory Visit: Payer: Self-pay | Admitting: Family Medicine

## 2017-10-14 MED ORDER — AMPHETAMINE-DEXTROAMPHETAMINE 10 MG PO TABS: ORAL_TABLET | ORAL | 0 refills | Status: DC

## 2017-10-14 NOTE — Telephone Encounter (Signed)
MEDICATION: amphetamine-dextroamphetamine (ADDERALL) 10 MG tablet  PHARMACY:  [facility]  IS THIS A 90 DAY SUPPLY : YES  IS PATIENT OUT OF MEDICATION: YES  IF NOT; HOW MUCH IS LEFT: n/a  LAST APPOINTMENT DATE: @6 /25/2018  NEXT APPOINTMENT DATE:@12 /02/2017  OTHER COMMENTS: UNABLE TO CHANGE PHARMACY! [facility]   **Let patient know to contact pharmacy at the end of the day to make sure medication is ready. **  ** Please notify patient to allow 48-72 hours to process**  **Encourage patient to contact the pharmacy for refills or they can [facility] ref[facility] through [facility]**

## 2017-10-14 NOTE — Telephone Encounter (Signed)
Okay refill. 

## 2017-10-14 NOTE — Telephone Encounter (Signed)
Please advise 

## 2017-10-22 ENCOUNTER — Ambulatory Visit (INDEPENDENT_AMBULATORY_CARE_PROVIDER_SITE_OTHER): Payer: Managed Care, Other (non HMO)

## 2017-10-22 DIAGNOSIS — Z23 Encounter for immunization: Secondary | ICD-10-CM | POA: Diagnosis not present

## 2018-01-20 ENCOUNTER — Other Ambulatory Visit: Payer: Self-pay | Admitting: Family Medicine

## 2018-01-20 MED ORDER — AMPHETAMINE-DEXTROAMPHETAMINE 10 MG PO TABS: ORAL_TABLET | ORAL | 0 refills | Status: DC

## 2018-01-20 NOTE — Telephone Encounter (Signed)
Please advise on refill.

## 2018-03-24 ENCOUNTER — Other Ambulatory Visit: Payer: Self-pay | Admitting: Family Medicine

## 2018-03-24 ENCOUNTER — Encounter: Payer: Self-pay | Admitting: Surgical

## 2018-03-24 NOTE — Telephone Encounter (Signed)
Time for a visit 

## 2018-03-24 NOTE — Telephone Encounter (Signed)
Please advise on refill.

## 2018-03-26 ENCOUNTER — Ambulatory Visit (INDEPENDENT_AMBULATORY_CARE_PROVIDER_SITE_OTHER): Payer: Managed Care, Other (non HMO) | Admitting: Family Medicine

## 2018-03-26 ENCOUNTER — Encounter: Payer: Self-pay | Admitting: Family Medicine

## 2018-03-26 VITALS — BP 142/80 | HR 75 | Temp 98.2°F | Ht 70.0 in | Wt 240.8 lb

## 2018-03-26 DIAGNOSIS — F902 Attention-deficit hyperactivity disorder, combined type: Secondary | ICD-10-CM | POA: Diagnosis not present

## 2018-03-26 MED ORDER — AMPHETAMINE-DEXTROAMPHETAMINE 20 MG PO TABS: 20.0000 mg | ORAL_TABLET | Freq: Two times a day (BID) | ORAL | 0 refills | Status: DC

## 2018-03-26 NOTE — Progress Notes (Signed)
   Stephen Lawrence is a 32 y.o. male is here for follow up.  History of Present Illness:   HPI: Since the last visit has the patient had any:  Appetite changes? No Unintentional weight loss? No Is medication working well ? Yes Does patient take drug holidays? No Difficulties falling to sleep or maintaining sleep? No Any anxiety?  No Any cardiac issues (fainting or paliptations)? No Suicidal thoughts? No Changes in health since last visit? No New medications? No Any illicit substance abuse? No Has the patient taken his medication today? Yes  There are no preventive care reminders to display for this patient. Depression screen PHQ 2/9 02/20/2017  Decreased Interest 0  Down, Depressed, Hopeless 0  PHQ - 2 Score 0   PMHx, SurgHx, SocialHx, FamHx, Medications, and Allergies were reviewed in the Visit Navigator and updated as appropriate.   Patient Active Problem List   Diagnosis Date Noted  . Class 1 obesity due to excess calories without serious comorbidity with body mass index (BMI) of 33.0 to 33.9 in adult 02/23/2017  . Low vitamin D level 02/23/2017  . ADD (attention deficit disorder) 03/22/2015   Social History   Tobacco Use  . Smoking status: Never Smoker  . Smokeless tobacco: Never Used  Substance Use Topics  . Alcohol use: Yes    Comment: Socially  . Drug use: No   Current Medications and Allergies:   .  amphetamine-dextroamphetamine (ADDERALL) 10 MG tablet, Take 1 tablet (10 mg total) by mouth 2 (two) times daily., Disp: 60 tablet, Rfl: 0 .  Diclofenac Sodium 2 % SOLN, Apply twice daily to affected area, Disp: 1 Bottle, Rfl: 2  No Known Allergies   Review of Systems   Pertinent items are noted in the HPI. Otherwise, ROS is negative.  Vitals:   Vitals:   03/26/18 1032  BP: (!) 142/80  Pulse: 75  Temp: 98.2 F (36.8 C)  TempSrc: Oral  SpO2: 97%  Weight: 240 lb 12.8 oz (109.2 kg)  Height:  (1.778 m)     Body mass index is 34.55 kg/m.    Physical Exam:   Physical Exam  Constitutional: He appears well-developed and well-nourished.  HENT:  Head: Normocephalic and atraumatic.  Eyes: Pupils are equal, round, and reactive to light. Conjunctivae and EOM are normal.  Pulmonary/Chest: Effort normal.  Nursing note and vitals reviewed.  Assessment and Plan:   Diagnoses and all orders for this visit:  Attention deficit hyperactivity disorder (ADHD), combined type -     amphetamine-dextroamphetamine (ADDERALL) 20 MG tablet; Take 1 tablet (20 mg total) by mouth 2 (two) times daily. -     amphetamine-dextroamphetamine (ADDERALL) 20 MG tablet; Take 1 tablet (20 mg total) by mouth 2 (two) times daily. -     amphetamine-dextroamphetamine (ADDERALL) 20 MG tablet; Take 1 tablet (20 mg total) by mouth 2 (two) times daily.    . Reviewed expectations re: course of current medical issues. . Discussed self-management of symptoms. . Outlined signs and symptoms indicating need for more acute intervention. . Patient verbalized understanding and all questions were answered. Marland Kitchen Health Maintenance issues including appropriate healthy diet, exercise, and smoking avoidance were discussed with patient. . See orders for this visit as documented in the electronic medical record. . Patient received an After Visit Summary.  Helane Rima, DO [place], [place] 03/26/2018  No future appointments.

## 2018-03-31 ENCOUNTER — Ambulatory Visit: Payer: Managed Care, Other (non HMO) | Admitting: Family Medicine

## 2018-05-29 IMAGING — DX DG TOE 2ND 2+V*L*
3 series · 3 of 3 positions shown · non-contrast
Comparison: None.

CLINICAL DATA: Second toe pain after trauma.  Mild bruising.

EXAM:
LEFT SECOND TOE

[toe ap]
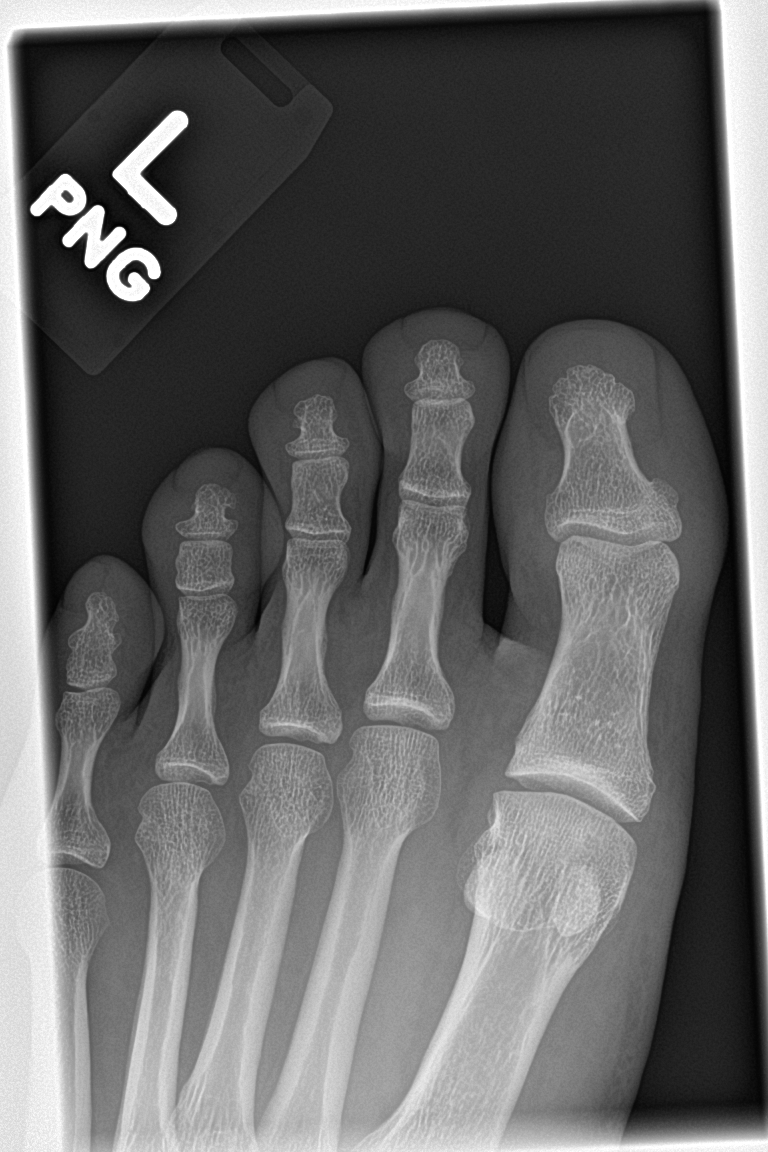

[toe obl]
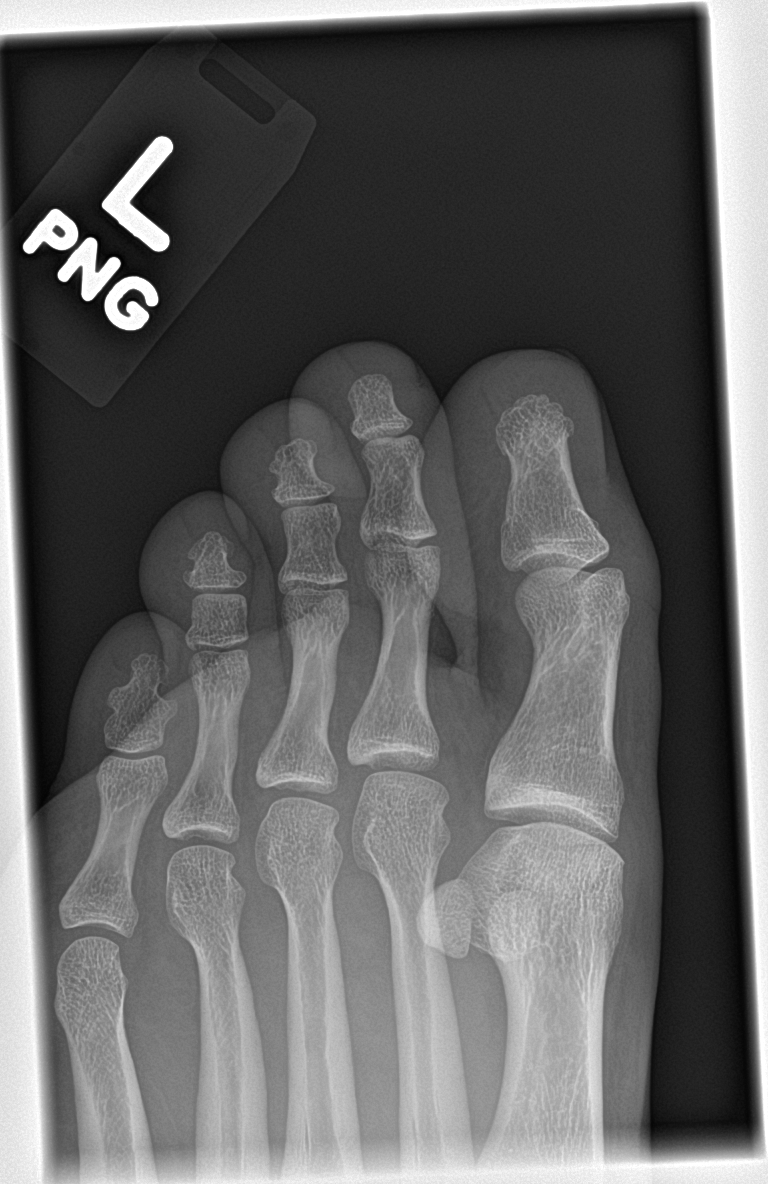

[toe lat]
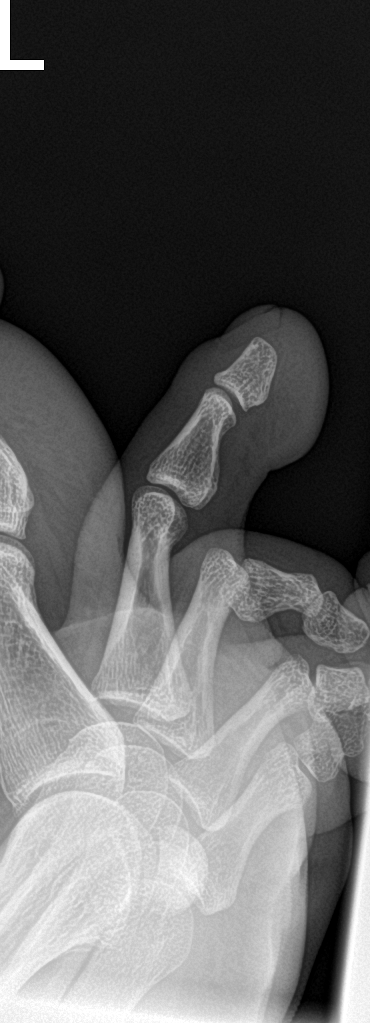

[3 of 3 positions shown; findings below may reference images not displayed]

FINDINGS: There is no evidence of fracture or dislocation. There is no
evidence of arthropathy or other focal bone abnormality. Soft
tissues are unremarkable.
IMPRESSION: Negative.

## 2018-07-04 ENCOUNTER — Other Ambulatory Visit: Payer: Self-pay | Admitting: Family Medicine

## 2018-07-04 DIAGNOSIS — F902 Attention-deficit hyperactivity disorder, combined type: Secondary | ICD-10-CM

## 2018-07-04 MED ORDER — AMPHETAMINE-DEXTROAMPHETAMINE 20 MG PO TABS: 20.0000 mg | ORAL_TABLET | Freq: Two times a day (BID) | ORAL | 0 refills | Status: DC

## 2018-07-04 NOTE — Telephone Encounter (Signed)
Last appointment 03/26/18 F/u no app at this time.

## 2018-08-04 ENCOUNTER — Other Ambulatory Visit: Payer: Self-pay | Admitting: Family Medicine

## 2018-08-04 DIAGNOSIS — F902 Attention-deficit hyperactivity disorder, combined type: Secondary | ICD-10-CM

## 2018-08-05 MED ORDER — AMPHETAMINE-DEXTROAMPHETAMINE 20 MG PO TABS: 20.0000 mg | ORAL_TABLET | Freq: Two times a day (BID) | ORAL | 0 refills | Status: DC

## 2018-09-05 ENCOUNTER — Other Ambulatory Visit: Payer: Self-pay | Admitting: Family Medicine

## 2018-09-05 DIAGNOSIS — F902 Attention-deficit hyperactivity disorder, combined type: Secondary | ICD-10-CM

## 2018-09-05 MED ORDER — AMPHETAMINE-DEXTROAMPHETAMINE 20 MG PO TABS: 20.0000 mg | ORAL_TABLET | Freq: Two times a day (BID) | ORAL | 0 refills | Status: DC

## 2018-09-05 NOTE — Telephone Encounter (Signed)
Please see msg and advise Last OV: 03/26/18

## 2018-09-05 NOTE — Telephone Encounter (Signed)
Please advise 

## 2018-10-08 ENCOUNTER — Other Ambulatory Visit: Payer: Self-pay | Admitting: Family Medicine

## 2018-10-08 DIAGNOSIS — F902 Attention-deficit hyperactivity disorder, combined type: Secondary | ICD-10-CM

## 2018-10-09 ENCOUNTER — Other Ambulatory Visit: Payer: Self-pay | Admitting: Family Medicine

## 2018-10-09 DIAGNOSIS — F902 Attention-deficit hyperactivity disorder, combined type: Secondary | ICD-10-CM

## 2018-10-09 MED ORDER — AMPHETAMINE-DEXTROAMPHETAMINE 20 MG PO TABS: 20.0000 mg | ORAL_TABLET | Freq: Two times a day (BID) | ORAL | 0 refills | Status: DC

## 2018-10-09 NOTE — Telephone Encounter (Signed)
Last o/v 03/26/18 Last script 09/05/18  Adderal 20 bid #60  F/u app made with pt for CPE and med f/u in 40min on 11/10/18 Ok to fill

## 2018-11-09 NOTE — Progress Notes (Signed)
Subjective:    Stephen Lawrence is a 32 y.o. male who presents today for his Complete Annual Exam. .  Since the last visit has the patient had any: patient is currently taking Adderall 20mg  BID with no problems or side effects.   Appetite changes? No Unintentional weight loss? No Is medication working well ? Yes Does patient take drug holidays? No Difficulties falling to sleep or maintaining sleep? No Any anxiety?  No Any cardiac issues (fainting or paliptations)? No Suicidal thoughts? No Changes in health since last visit? No New medications? No Any illicit substance abuse? No Has the patient taken his medication today? Yes  Current Outpatient Medications:  .  amphetamine-dextroamphetamine (ADDERALL) 20 MG tablet, Take 1 tablet (20 mg total) by mouth 2 (two) times daily., Disp: 60 tablet, Rfl: 0  Health Maintenance Due  Topic Date Due  . INFLUENZA VACCINE  06/19/2018   PMHx, SurgHx, SocialHx, Medications, and Allergies were reviewed in the Visit Navigator and updated as appropriate.   Past Medical History:  Diagnosis Date  . ADD (attention deficit disorder)   . Class 1 obesity due to excess calories without serious comorbidity with body mass index (BMI) of 33.0 to 33.9 in adult 02/23/2017  . Low vitamin D level 02/23/2017     Past Surgical History:  Procedure Laterality Date  . VASECTOMY  2016  . WISDOM TOOTH EXTRACTION  2005     Family History  Problem Relation Age of Onset  . Meniere's disease Father    Social History   Tobacco Use  . Smoking status: Never Smoker  . Smokeless tobacco: Never Used  Substance Use Topics  . Alcohol use: Yes    Comment: Socially  . Drug use: No   Review of Systems:   Pertinent items are noted in the HPI. Otherwise, ROS is negative.  Objective:   Vitals:   11/10/18 0747  BP: 140/78  Pulse: 74  Temp: 97.9 F (36.6 C)  SpO2: 98%   Body mass index is 35.58 kg/m.  General Appearance:  Alert, cooperative, no distress,  appears stated age  Head:  Normocephalic, without obvious abnormality, atraumatic  Eyes:  PERRL, conjunctiva/corneas clear, EOM's intact, fundi benign, both eyes       Ears:  Normal TM's and external ear canals, both ears  Nose: Nares normal, septum midline, mucosa normal, no drainage    or sinus tenderness  Throat: Lips, mucosa, and tongue normal; teeth and gums normal  Neck: Supple, symmetrical, trachea midline, no adenopathy; thyroid:  No enlargement/tenderness/nodules; no carotit bruit or JVD  Back:   Symmetric, no curvature, ROM normal, no CVA tenderness  Lungs:   Clear to auscultation bilaterally, respirations unlabored  Chest wall:  No tenderness or deformity  Heart:  Regular rate and rhythm, S1 and S2 normal, no murmur, rub   or gallop  Abdomen:   Soft, non-tender, bowel sounds active all four quadrants, no masses, no organomegaly  Extremities: Extremities normal, atraumatic, no cyanosis or edema  Prostate: Not done.   Skin: Skin color, texture, turgor normal, no rashes or lesions. Seb derm on face.  Lymph nodes: Cervical, supraclavicular, and axillary nodes normal  Neurologic: CNII-XII grossly intact. Normal strength, sensation and reflexes throughout   Assessment/Plan:   Stephen HaileyDustin was seen today for follow-up.  Diagnoses and all orders for this visit:  Routine physical examination  Pure hypercholesterolemia -     Comprehensive metabolic panel -     Lipid panel  Attention deficit hyperactivity disorder (ADHD),  combined type -     amphetamine-dextroamphetamine (ADDERALL) 20 MG tablet; Take 1 tablet (20 mg total) by mouth 2 (two) times daily. -     amphetamine-dextroamphetamine (ADDERALL) 20 MG tablet; Take 1 tablet (20 mg total) by mouth 2 (two) times daily. -     amphetamine-dextroamphetamine (ADDERALL) 20 MG tablet; Take 1 tablet (20 mg total) by mouth 2 (two) times daily.  Low vitamin D level  Weight gain -     POCT glycosylated hemoglobin (Hb A1C)  Seborrheic  dermatitis -     clotrimazole-betamethasone (LOTRISONE) cream; Apply 1 application topically 2 (two) times daily.   Patient Counseling: [x]   Nutrition: Stressed importance of moderation in sodium/caffeine intake, saturated fat and cholesterol, caloric balance, sufficient intake of fresh fruits, vegetables, and fiber.  [x]   Stressed the importance of regular exercise.   []   Substance Abuse: Discussed cessation/primary prevention of tobacco, alcohol, or other drug use; driving or other dangerous activities under the influence; availability of treatment for abuse.   [x]   Injury prevention: Discussed safety belts, safety helmets, smoke detector, smoking near bedding or upholstery.   []   Sexuality: Discussed sexually transmitted diseases, partner selection, use of condoms, avoidance of unintended pregnancy and contraceptive alternatives.   [x]   Dental health: Discussed importance of regular tooth brushing, flossing, and dental visits.  [x]   Health maintenance and immunizations reviewed. Please refer to Health maintenance section.    Helane RimaErica Kynnedi Zweig, DO [place] [facility]

## 2018-11-10 ENCOUNTER — Ambulatory Visit (INDEPENDENT_AMBULATORY_CARE_PROVIDER_SITE_OTHER): Payer: Managed Care, Other (non HMO) | Admitting: Family Medicine

## 2018-11-10 ENCOUNTER — Encounter: Payer: Self-pay | Admitting: Family Medicine

## 2018-11-10 VITALS — BP 140/78 | HR 74 | Temp 97.9°F | Ht 70.0 in | Wt 248.0 lb

## 2018-11-10 DIAGNOSIS — R635 Abnormal weight gain: Secondary | ICD-10-CM | POA: Diagnosis not present

## 2018-11-10 DIAGNOSIS — Z0000 Encounter for general adult medical examination without abnormal findings: Secondary | ICD-10-CM

## 2018-11-10 DIAGNOSIS — F902 Attention-deficit hyperactivity disorder, combined type: Secondary | ICD-10-CM | POA: Diagnosis not present

## 2018-11-10 DIAGNOSIS — Z23 Encounter for immunization: Secondary | ICD-10-CM

## 2018-11-10 DIAGNOSIS — E7800 Pure hypercholesterolemia, unspecified: Secondary | ICD-10-CM

## 2018-11-10 DIAGNOSIS — R7989 Other specified abnormal findings of blood chemistry: Secondary | ICD-10-CM | POA: Diagnosis not present

## 2018-11-10 DIAGNOSIS — L219 Seborrheic dermatitis, unspecified: Secondary | ICD-10-CM

## 2018-11-10 LAB — LIPID PANEL
Cholesterol: 160 mg/dL (ref 0–200)
HDL: 45.4 mg/dL (ref >39.00)
LDL Cholesterol: 103 mg/dL — ABNORMAL HIGH (ref 0–99)
NonHDL: 114.27
Total CHOL/HDL Ratio: 4
Triglycerides: 58 mg/dL (ref 0.0–149.0)
VLDL: 11.6 mg/dL (ref 0.0–40.0)

## 2018-11-10 LAB — COMPREHENSIVE METABOLIC PANEL
ALT: 17 U/L (ref 0–53)
AST: 14 U/L (ref 0–37)
Albumin: 4.3 g/dL (ref 3.5–5.2)
Alkaline Phosphatase: 63 U/L (ref 39–117)
BUN: 14 mg/dL (ref 6–23)
CO2: 27 mEq/L (ref 19–32)
Calcium: 9.3 mg/dL (ref 8.4–10.5)
Chloride: 106 mEq/L (ref 96–112)
Creatinine, Ser: 0.97 mg/dL (ref 0.40–1.50)
GFR: 96.16 mL/min (ref >60.00)
Glucose, Bld: 85 mg/dL (ref 70–99)
Potassium: 4.1 mEq/L (ref 3.5–5.1)
Sodium: 141 mEq/L (ref 135–145)
Total Bilirubin: 0.3 mg/dL (ref 0.2–1.2)
Total Protein: 6.4 g/dL (ref 6.0–8.3)

## 2018-11-10 LAB — POCT GLYCOSYLATED HEMOGLOBIN (HGB A1C): Hemoglobin A1C: 4.8 % (ref 4.0–5.6)

## 2018-11-10 MED ORDER — AMPHETAMINE-DEXTROAMPHETAMINE 20 MG PO TABS: 20.0000 mg | ORAL_TABLET | Freq: Two times a day (BID) | ORAL | 0 refills | Status: DC

## 2018-11-10 MED ORDER — CLOTRIMAZOLE-BETAMETHASONE 1-0.05 % EX CREA: 1.0000 "application " | TOPICAL_CREAM | Freq: Two times a day (BID) | CUTANEOUS | 0 refills | Status: DC

## 2018-11-15 ENCOUNTER — Encounter: Payer: Self-pay | Admitting: Family Medicine

## 2018-12-12 ENCOUNTER — Other Ambulatory Visit: Payer: Self-pay | Admitting: Family Medicine

## 2018-12-12 DIAGNOSIS — F902 Attention-deficit hyperactivity disorder, combined type: Secondary | ICD-10-CM

## 2018-12-12 NOTE — Telephone Encounter (Signed)
Patient has prescriptions, I think. Please confirm.

## 2018-12-12 NOTE — Telephone Encounter (Signed)
Ok to fill 

## 2018-12-15 NOTE — Telephone Encounter (Signed)
Message sent to patient to see if he has scripts.

## 2018-12-16 MED ORDER — AMPHETAMINE-DEXTROAMPHETAMINE 20 MG PO TABS: 20.0000 mg | ORAL_TABLET | Freq: Two times a day (BID) | ORAL | 0 refills | Status: DC

## 2018-12-16 NOTE — Telephone Encounter (Signed)
FYI

## 2019-01-13 ENCOUNTER — Telehealth: Payer: BLUE CROSS/BLUE SHIELD | Admitting: Physician Assistant

## 2019-01-13 DIAGNOSIS — M791 Myalgia, unspecified site: Secondary | ICD-10-CM

## 2019-01-13 DIAGNOSIS — R51 Headache: Secondary | ICD-10-CM

## 2019-01-13 DIAGNOSIS — J029 Acute pharyngitis, unspecified: Secondary | ICD-10-CM | POA: Diagnosis not present

## 2019-01-13 DIAGNOSIS — R519 Headache, unspecified: Secondary | ICD-10-CM

## 2019-01-13 MED ORDER — OSELTAMIVIR PHOSPHATE 75 MG PO CAPS: 75.0000 mg | ORAL_CAPSULE | Freq: Two times a day (BID) | ORAL | 0 refills | Status: DC

## 2019-01-13 NOTE — Progress Notes (Signed)

## 2019-01-15 ENCOUNTER — Other Ambulatory Visit: Payer: Self-pay | Admitting: Family Medicine

## 2019-01-15 DIAGNOSIS — F902 Attention-deficit hyperactivity disorder, combined type: Secondary | ICD-10-CM

## 2019-01-16 MED ORDER — AMPHETAMINE-DEXTROAMPHETAMINE 20 MG PO TABS: 20.0000 mg | ORAL_TABLET | Freq: Two times a day (BID) | ORAL | 0 refills | Status: DC

## 2019-01-23 ENCOUNTER — Encounter: Payer: Self-pay | Admitting: Family Medicine

## 2019-02-04 MED ORDER — ALUMINUM CHLORIDE 20 % EX SOLN: Freq: Every day | CUTANEOUS | 3 refills | Status: DC

## 2019-02-13 ENCOUNTER — Other Ambulatory Visit: Payer: Self-pay | Admitting: Family Medicine

## 2019-02-13 DIAGNOSIS — F902 Attention-deficit hyperactivity disorder, combined type: Secondary | ICD-10-CM

## 2019-02-13 MED ORDER — AMPHETAMINE-DEXTROAMPHETAMINE 20 MG PO TABS: 20.0000 mg | ORAL_TABLET | Freq: Two times a day (BID) | ORAL | 0 refills | Status: DC

## 2019-03-13 ENCOUNTER — Ambulatory Visit: Payer: Self-pay

## 2019-03-13 ENCOUNTER — Ambulatory Visit (INDEPENDENT_AMBULATORY_CARE_PROVIDER_SITE_OTHER): Payer: BLUE CROSS/BLUE SHIELD | Admitting: Family Medicine

## 2019-03-13 VITALS — Temp 97.0°F

## 2019-03-13 DIAGNOSIS — J329 Chronic sinusitis, unspecified: Secondary | ICD-10-CM

## 2019-03-13 DIAGNOSIS — R6889 Other general symptoms and signs: Secondary | ICD-10-CM | POA: Diagnosis not present

## 2019-03-13 DIAGNOSIS — R05 Cough: Secondary | ICD-10-CM | POA: Diagnosis not present

## 2019-03-13 DIAGNOSIS — Z20822 Contact with and (suspected) exposure to covid-19: Secondary | ICD-10-CM

## 2019-03-13 DIAGNOSIS — R059 Cough, unspecified: Secondary | ICD-10-CM

## 2019-03-13 MED ORDER — AZITHROMYCIN 250 MG PO TABS: ORAL_TABLET | ORAL | 0 refills | Status: DC

## 2019-03-13 NOTE — Telephone Encounter (Signed)
Patient is actually seeing Dr. Jimmey Ralph.

## 2019-03-13 NOTE — Telephone Encounter (Signed)
Pt. called COVID Nurse line.  Reported onset of dry cough yesterday.  Reported worsening cough today; coughing up "dark yellow sputum".  C/o burning in lungs with cough; "it feels like my lungs are on fire, each time I cough."   Denied actual shortness of breath, but stated he usually can hold his breath for about 40 seconds, but only able to hold it about 10 seconds today.  Denied fever this AM.  Stated he generally does not feel good today, and also worse than yesterday.  C/o headache.  Diarrhea stool x 1 today.   Is not aware if he has been exposed to someone diag. With COVID 19.   Called FC; transferred call to office to be scheduled for virtual visit today; agreed with plan.    Reason for Disposition . Chest pain    Feels like a burning in his lungs with cough; coughing up dark yellow sputum  Answer Assessment - Initial Assessment Questions 1. COVID-19 DIAGNOSIS: "Who made your Coronavirus (COVID-19) diagnosis?" "Was it confirmed by a positive lab test?" If not diagnosed by a HCP, ask "Are there lots of cases (community spread) where you live?" (See public health department website, if unsure)   * MAJOR community spread: high number of cases; numbers of cases are increasing; many people hospitalized.   * MINOR community spread: low number of cases; not increasing; few or no people hospitalized     Present in community 2. ONSET: "When did the COVID-19 symptoms start?"     03/12/19 3. WORST SYMPTOM: "What is your worst symptom?" (e.g., cough, fever, shortness of breath, muscle aches)     Cough and lung pain; feels like lungs are on fire  4. COUGH: "How bad is the cough?"       Dry cough with increased in freq. Over past 24 hrs. 5. FEVER: "Do you have a fever?" If so, ask: "What is your temperature, how was it measured, and when did it start?"     Denied fever 6. RESPIRATORY STATUS: "Describe your breathing?" (e.g., shortness of breath, wheezing, unable to speak)      Feels his lungs are on fire  with coughing; reported difficulty holding breath 7. BETTER-SAME-WORSE: "Are you getting better, staying the same or getting worse compared to yesterday?"  If getting worse, ask, "In what way?"     Overall feels worse today compared to yesterday 8. HIGH RISK DISEASE: "Do you have any chronic medical problems?" (e.g., asthma, heart or lung disease, weak immune system, etc.)     Denied any chronic problems  9. PREGNANCY: "Is there any chance you are pregnant?" "When was your last menstrual period?"     N/a  10. OTHER SYMPTOMS: "Do you have any other symptoms?"  (e.g., runny nose, headache, sore throat, loss of smell)      Coughing up dark yellow sputum. Headache, diarrhea stool today, generally feels poor today  Protocols used: CORONAVIRUS (COVID-19) DIAGNOSED OR SUSPECTED-A-AH

## 2019-03-13 NOTE — Telephone Encounter (Signed)
Forwarding to Dr. Parker as FYI.  

## 2019-03-13 NOTE — Progress Notes (Signed)
    Chief Complaint:  Stephen Lawrence is a 33 y.o. male who presents today for a virtual office visit with a chief complaint of cough.   Assessment/Plan:  Cough /suspected COVID Patient symptoms very concerning for likely COVID-19 infection.  He does not have any red flag signs or symptoms.  Appears well on visual examination.  It is possible he may have some underlying sinusitis given his facial pain.  Will start azithromycin to treat for possible sinusitis and also potentially give prophylaxis against secondary pneumonia.  Encouraged good oral hydration.  Recommended social isolation for at least the next 7 days.  Discussed reasons to return to care and seek emergent care.  Follow-up as needed.    Subjective:  HPI:  Cough Started yesterday morning.  Associated symptoms include dark yellow sputum production, sinus pressure, one episode of diarrhea, rhinorrhea, and headache.  He has had some pain/pressure to his right cheek.  He has also had "burning in his lungs".  Denies any chest pressure or chest pain.  The burning sensation in his lungs only occurs with deep inspiration with a cough.  No known sick contacts.  No specific treatments tried.  No other obvious alleviating or aggravating factors.  ROS: Per HPI  PMH: He reports that he has never smoked. He has never used smokeless tobacco. He reports current alcohol use. He reports that he does not use drugs.      Objective/Observations  Physical Exam: Gen: NAD, resting comfortably Pulm: Normal work of breathing Neuro: Grossly normal, moves all extremities Psych: Normal affect and thought content  Virtual Visit via Video   I connected with Stephen Lawrence on 03/13/19 at 10:00 AM EDT by a video enabled telemedicine application and verified that I am speaking with the correct person using two identifiers. I discussed the limitations of evaluation and management by telemedicine and the availability of in person appointments. The patient  expressed understanding and agreed to proceed.   Patient location: Home Provider location: [place] [facility] [employer] Persons participating in the virtual visit: Myself and Patient     Katina Degree. Jimmey Ralph, MD 03/13/2019 9:35 AM

## 2019-03-16 ENCOUNTER — Encounter: Payer: Self-pay | Admitting: Family Medicine

## 2019-03-16 ENCOUNTER — Other Ambulatory Visit: Payer: Self-pay

## 2019-03-16 ENCOUNTER — Other Ambulatory Visit: Payer: Self-pay | Admitting: Family Medicine

## 2019-03-16 DIAGNOSIS — F902 Attention-deficit hyperactivity disorder, combined type: Secondary | ICD-10-CM

## 2019-03-16 MED ORDER — AMPHETAMINE-DEXTROAMPHETAMINE 20 MG PO TABS: 20.0000 mg | ORAL_TABLET | Freq: Two times a day (BID) | ORAL | 0 refills | Status: DC

## 2019-03-16 MED ORDER — BENZONATATE 200 MG PO CAPS: 200.0000 mg | ORAL_CAPSULE | Freq: Three times a day (TID) | ORAL | 0 refills | Status: DC | PRN

## 2019-03-17 ENCOUNTER — Encounter: Payer: Self-pay | Admitting: Family Medicine

## 2019-03-19 ENCOUNTER — Ambulatory Visit: Payer: Self-pay | Admitting: Physician Assistant

## 2019-04-15 ENCOUNTER — Other Ambulatory Visit: Payer: Self-pay | Admitting: Family Medicine

## 2019-04-15 DIAGNOSIS — F902 Attention-deficit hyperactivity disorder, combined type: Secondary | ICD-10-CM

## 2019-04-20 ENCOUNTER — Other Ambulatory Visit: Payer: Self-pay | Admitting: Family Medicine

## 2019-04-20 DIAGNOSIS — F902 Attention-deficit hyperactivity disorder, combined type: Secondary | ICD-10-CM

## 2019-04-20 NOTE — Telephone Encounter (Signed)
Last OV (acute appt) 03/13/19 Last OV for ADD F/U was 03/26/18 Next OV: 05/12/19 PMP website checked: last filled 03/16/19

## 2019-04-20 NOTE — Telephone Encounter (Signed)
Copied from CRM [id number]. Topic: General - Other >> Apr 20, 2019  9:16 AM Leafy Ro wrote: Reason for CRM:pt has sent mychart message. Pt would like refill on generic adderall 20  mg. [facility] [place]

## 2019-04-20 NOTE — Telephone Encounter (Signed)
Needs appointment

## 2019-04-20 NOTE — Telephone Encounter (Signed)
Pt has been scheduled for 04/24/2019 and has been out of medication since 5/27. Please advise.

## 2019-04-20 NOTE — Telephone Encounter (Signed)
See note

## 2019-04-21 MED ORDER — AMPHETAMINE-DEXTROAMPHETAMINE 20 MG PO TABS: 20.0000 mg | ORAL_TABLET | Freq: Two times a day (BID) | ORAL | 0 refills | Status: DC

## 2019-04-21 NOTE — Addendum Note (Signed)
Addended by: Helane Rima R on: 04/21/2019 04:42 PM   Modules accepted: Orders

## 2019-04-21 NOTE — Telephone Encounter (Signed)
Ok to fill 

## 2019-04-23 NOTE — Progress Notes (Signed)
Virtual Visit via Video   Due to the COVID-19 pandemic, this visit was completed with telemedicine (audio/video) technology to reduce patient and provider exposure as well as to preserve personal protective equipment.   I connected with DAT BJORKMAN by a video enabled telemedicine application and verified that I am speaking with the correct person using two identifiers. Location patient: Home Location provider: [facility], Office Persons participating in the virtual visit: TILDON BAUER, Helane Rima, DO Barnie Mort, CMA acting as scribe for Dr. Helane Rima.   I discussed the limitations of evaluation and management by telemedicine and the availability of in person appointments. The patient expressed understanding and agreed to proceed.  Care Team   Patient Care Team: Helane Rima, DO as PCP - General (Family Medicine)  Subjective:   HPI:  ADHD Since the last visit has the patient had any:  Appetite changes? No Unintentional weight loss? No Is medication working well ? Yes Does patient take drug holidays? No Difficulties falling to sleep or maintaining sleep? No Any anxiety?  No Any cardiac issues (fainting or paliptations)? No Suicidal thoughts? No Changes in health since last visit? No New medications? No Any illicit substance abuse? No Has the patient taken his medication today? Out after 04/15/2019  Review of Systems  Constitutional: Negative for chills and fever.  HENT: Negative for hearing loss and tinnitus.   Eyes: Negative for blurred vision and double vision.  Respiratory: Negative for cough.   Cardiovascular: Negative for chest pain and palpitations.  Gastrointestinal: Negative for heartburn and nausea.  Genitourinary: Negative for dysuria and urgency.  Musculoskeletal: Negative for myalgias.  Skin: Negative for rash.  Neurological: Negative for dizziness and headaches.  Psychiatric/Behavioral: Negative for depression and suicidal ideas.     Patient Active Problem List   Diagnosis Date Noted  . Class 1 obesity due to excess calories without serious comorbidity with body mass index (BMI) of 33.0 to 33.9 in adult 02/23/2017  . Low vitamin D level 02/23/2017  . ADD (attention deficit disorder) 03/22/2015    Social History   Tobacco Use  . Smoking status: Never Smoker  . Smokeless tobacco: Never Used  Substance Use Topics  . Alcohol use: Yes    Comment: Socially   Current Outpatient Medications:  .  amphetamine-dextroamphetamine (ADDERALL) 20 MG tablet, Take 1 tablet (20 mg total) by mouth 2 (two) times daily for 30 days., Disp: 60 tablet, Rfl: 0  No Known Allergies  Objective:   VITALS: Per patient if applicable, see vitals. GENERAL: Alert, appears well and in no acute distress. HEENT: Atraumatic, conjunctiva clear, no obvious abnormalities on inspection of external nose and ears. NECK: Normal movements of the head and neck. CARDIOPULMONARY: No increased WOB. Speaking in clear sentences. I:E ratio WNL.  MS: Moves all visible extremities without noticeable abnormality. PSYCH: Pleasant and cooperative, well-groomed. Speech normal rate and rhythm. Affect is appropriate. Insight and judgement are appropriate. Attention is focused, linear, and appropriate.  NEURO: CN grossly intact. Oriented as arrived to appointment on time with no prompting. Moves both UE equally.  SKIN: No obvious lesions, wounds, erythema, or cyanosis noted on face or hands.  Depression screen [facility] 2/9 11/10/2018 02/20/2017  Decreased Interest 0 0  Down, Depressed, Hopeless 0 0  PHQ - 2 Score 0 0  Altered sleeping 0 -  Tired, decreased energy 0 -  Change in appetite 0 -  Feeling bad or failure about yourself  0 -  Trouble concentrating 0 -  Moving slowly or fidgety/restless 0 -  Suicidal thoughts 0 -  PHQ-9 Score 0 -  Difficult doing work/chores Not difficult at all -    Assessment and Plan:   Stephen Lawrence was seen today for medication  refill.  Diagnoses and all orders for this visit:  Attention deficit hyperactivity disorder (ADHD), combined type -     amphetamine-dextroamphetamine (ADDERALL) 20 MG tablet; Take 1 tablet (20 mg total) by mouth 2 (two) times daily.  Psychophysiological insomnia -     traZODone (DESYREL) 50 MG tablet; Take 0.5-1 tablets (25-50 mg total) by mouth at bedtime as needed for sleep.   Marland Kitchen. COVID-19 Education: The signs and symptoms of COVID-19 were discussed with the patient and how to seek care for testing if needed. The importance of social distancing was discussed today. . Reviewed expectations re: course of current medical issues. . Discussed self-management of symptoms. . Outlined signs and symptoms indicating need for more acute intervention. . Patient verbalized understanding and all questions were answered. Marland Kitchen. Health Maintenance issues including appropriate healthy diet, exercise, and smoking avoidance were discussed with patient. . See orders for this visit as documented in the electronic medical record.  Helane RimaErica Annlouise Gerety, DO  Records requested if needed. Time spent: 25 minutes, of which >50% was spent in obtaining information about his symptoms, reviewing his previous labs, evaluations, and treatments, counseling him about his condition (please see the discussed topics above), and developing a plan to further investigate it; he had a number of questions which I addressed.

## 2019-04-24 ENCOUNTER — Other Ambulatory Visit: Payer: Self-pay

## 2019-04-24 ENCOUNTER — Ambulatory Visit (INDEPENDENT_AMBULATORY_CARE_PROVIDER_SITE_OTHER): Payer: BLUE CROSS/BLUE SHIELD | Admitting: Family Medicine

## 2019-04-24 ENCOUNTER — Encounter: Payer: Self-pay | Admitting: Family Medicine

## 2019-04-24 VITALS — Ht 70.0 in | Wt 248.0 lb

## 2019-04-24 DIAGNOSIS — F902 Attention-deficit hyperactivity disorder, combined type: Secondary | ICD-10-CM

## 2019-04-24 DIAGNOSIS — F5104 Psychophysiologic insomnia: Secondary | ICD-10-CM | POA: Diagnosis not present

## 2019-04-24 MED ORDER — AMPHETAMINE-DEXTROAMPHETAMINE 20 MG PO TABS: 20.0000 mg | ORAL_TABLET | Freq: Two times a day (BID) | ORAL | 0 refills | Status: DC

## 2019-04-24 MED ORDER — TRAZODONE HCL 50 MG PO TABS: 25.0000 mg | ORAL_TABLET | Freq: Every evening | ORAL | 3 refills | Status: DC | PRN

## 2019-05-12 ENCOUNTER — Ambulatory Visit: Payer: Self-pay | Admitting: Family Medicine

## 2019-05-16 ENCOUNTER — Other Ambulatory Visit: Payer: Self-pay | Admitting: Family Medicine

## 2019-05-16 DIAGNOSIS — F5104 Psychophysiologic insomnia: Secondary | ICD-10-CM

## 2019-05-19 ENCOUNTER — Other Ambulatory Visit: Payer: Self-pay | Admitting: Family Medicine

## 2019-05-19 DIAGNOSIS — F902 Attention-deficit hyperactivity disorder, combined type: Secondary | ICD-10-CM

## 2019-05-19 NOTE — Telephone Encounter (Signed)
See note

## 2019-05-20 MED ORDER — AMPHETAMINE-DEXTROAMPHETAMINE 20 MG PO TABS: 20.0000 mg | ORAL_TABLET | Freq: Two times a day (BID) | ORAL | 0 refills | Status: DC

## 2019-06-15 ENCOUNTER — Other Ambulatory Visit: Payer: Self-pay

## 2019-06-15 ENCOUNTER — Ambulatory Visit: Payer: BLUE CROSS/BLUE SHIELD | Admitting: Family Medicine

## 2019-06-15 ENCOUNTER — Encounter: Payer: Self-pay | Admitting: Family Medicine

## 2019-06-15 VITALS — BP 128/74 | HR 92 | Temp 98.0°F | Ht 70.0 in | Wt 249.8 lb

## 2019-06-15 DIAGNOSIS — H60332 Swimmer's ear, left ear: Secondary | ICD-10-CM

## 2019-06-15 MED ORDER — NEOMYCIN-POLYMYXIN-HC 3.5-10000-1 OT SOLN: 3.0000 [drp] | Freq: Three times a day (TID) | OTIC | 0 refills | Status: DC

## 2019-06-15 NOTE — Patient Instructions (Signed)
Please follow up if symptoms do not improve or as needed.   

## 2019-06-15 NOTE — Progress Notes (Signed)
Subjective  CC:  Chief Complaint  Patient presents with  . Acute Visit    HPI: Stephen Lawrence is a 33 y.o. male who presents to the office today to address the problems listed above in the chief complaint.  July 4th, went swimming, since c/o paresthesias is left ear with intermittent sharp pains, worse in am and pm. Took 7 "leftover" amoxicillin 2 weeks ago w/o relief. No hearing problems. No f/c/s. No ST. Otherwise feels fine.   Assessment  1. Acute swimmer's ear of left side      Plan   Subacute swimmer's ear:  Educated. May be due to partial abx treatment with oral amox. Will try cortisporin otic tid and recheck in 7-10 days if not improved.   Follow up: prn  07/24/2019  No orders of the defined types were placed in this encounter.  Meds ordered this encounter  Medications  . neomycin-polymyxin-hydrocortisone (CORTISPORIN) OTIC solution    Sig: Place 3 drops into the left ear 3 (three) times daily.    Dispense:  10 mL    Refill:  0      I reviewed the patients updated PMH, FH, and SocHx.    Patient Active Problem List   Diagnosis Date Noted  . Class 1 obesity due to excess calories without serious comorbidity with body mass index (BMI) of 33.0 to 33.9 in adult 02/23/2017  . Low vitamin D level 02/23/2017  . ADD (attention deficit disorder) 03/22/2015   Current Meds  Medication Sig  . amphetamine-dextroamphetamine (ADDERALL) 20 MG tablet Take 1 tablet (20 mg total) by mouth 2 (two) times daily.  Marland Kitchen amphetamine-dextroamphetamine (ADDERALL) 20 MG tablet Take 1 tablet (20 mg total) by mouth 2 (two) times daily.  . clotrimazole-betamethasone (LOTRISONE) cream Apply 1 application topically 2 (two) times daily.  . traZODone (DESYREL) 50 MG tablet TAKE 0.5-1 TABLETS (25-50 MG TOTAL) BY MOUTH AT BEDTIME AS NEEDED FOR SLEEP.    Allergies: Patient has No Known Allergies. Family History: Patient family history includes Meniere's disease in his father. Social History:   Patient  reports that he has never smoked. He has never used smokeless tobacco. He reports current alcohol use. He reports that he does not use drugs.  Review of Systems: Constitutional: Negative for fever malaise or anorexia Cardiovascular: negative for chest pain Respiratory: negative for SOB or persistent cough Gastrointestinal: negative for abdominal pain  Objective  Vitals: BP 128/74 (BP Location: Left Arm, Patient Position: Sitting, Cuff Size: Normal)   Pulse 92   Temp 98 F (36.7 C) (Oral)   Ht 5\' 10"  (1.778 m)   Wt 249 lb 12.8 oz (113.3 kg)   SpO2 97%   BMI 35.84 kg/m  General: no acute distress , A&Ox3 HEENT: PEERL, conjunctiva normal, TMs clear bilaterally; left EAC with mild redness and swelling w/ minimal ttp of pinna, No adenopathy, Oropharynx moist,neck is supple Skin:  Warm, no rashes     Commons side effects, risks, benefits, and alternatives for medications and treatment plan prescribed today were discussed, and the patient expressed understanding of the given instructions. Patient is instructed to call or message via MyChart if he/she has any questions or concerns regarding our treatment plan. No barriers to understanding were identified. We discussed Red Flag symptoms and signs in detail. Patient expressed understanding regarding what to do in case of urgent or emergency type symptoms.   Medication list was reconciled, printed and provided to the patient in AVS. Patient instructions and summary information was  reviewed with the patient as documented in the AVS. This note was prepared with assistance of Dragon voice recognition software. Occasional wrong-word or sound-a-like substitutions may have occurred due to the inherent limitations of voice recognition software

## 2019-06-17 MED ORDER — OFLOXACIN 0.3 % OT SOLN: 5.0000 [drp] | Freq: Every day | OTIC | 0 refills | Status: DC

## 2019-06-22 ENCOUNTER — Telehealth: Payer: BLUE CROSS/BLUE SHIELD | Admitting: Family Medicine

## 2019-07-22 ENCOUNTER — Encounter: Payer: Self-pay | Admitting: Family Medicine

## 2019-07-23 ENCOUNTER — Other Ambulatory Visit: Payer: Self-pay

## 2019-07-23 DIAGNOSIS — F902 Attention-deficit hyperactivity disorder, combined type: Secondary | ICD-10-CM

## 2019-07-23 NOTE — Progress Notes (Unsigned)
Virtual Visit via Video   Due to the COVID-19 pandemic, this visit was completed with telemedicine (audio/video) technology to reduce patient and provider exposure as well as to preserve personal protective equipment.   I connected with Stephen Lawrence by a video enabled telemedicine application and verified that I am speaking with the correct person using two identifiers. Location patient: Home Location provider: [facility], Office Persons participating in the virtual visit: Stephen Lawrence, Stephen Lawrence, CMA   I discussed the limitations of evaluation and management by telemedicine and the availability of in person appointments. The patient expressed understanding and agreed to proceed.  Care Team   Patient Care Team: Stephen Deutscher, DO as PCP - General (Family Medicine)  Subjective:   HPI:  ADHD:  Since the last visit has the patient had any:  Appetite changes? No Unintentional weight loss? No Is medication working well ? Yes Does patient take drug holidays? No Difficulties falling to sleep or maintaining sleep? No Any anxiety?  No Any cardiac issues (fainting or paliptations)? No Suicidal thoughts? No Changes in health since last visit? No New medications? No Any illicit substance abuse? No Has the patient taken his medication today? {Yes/No:[id number]}  ROS   Patient Active Problem List   Diagnosis Date Noted  . Class 1 obesity due to excess calories without serious comorbidity with body mass index (BMI) of 33.0 to 33.9 in adult 02/23/2017  . Low vitamin D level 02/23/2017  . ADD (attention deficit disorder) 03/22/2015    Social History   Tobacco Use  . Smoking status: Never Smoker  . Smokeless tobacco: Never Used  Substance Use Topics  . Alcohol use: Yes    Comment: Socially    Current Outpatient Medications:  .  amphetamine-dextroamphetamine (ADDERALL) 20 MG tablet, Take 1 tablet (20 mg total) by mouth 2 (two) times daily., Disp: 180 tablet, Rfl:  0 .  amphetamine-dextroamphetamine (ADDERALL) 20 MG tablet, Take 1 tablet (20 mg total) by mouth 2 (two) times daily., Disp: 180 tablet, Rfl: 0 .  clotrimazole-betamethasone (LOTRISONE) cream, Apply 1 application topically 2 (two) times daily., Disp: 30 g, Rfl: 0 .  traZODone (DESYREL) 50 MG tablet, TAKE 0.5-1 TABLETS (25-50 MG TOTAL) BY MOUTH AT BEDTIME AS NEEDED FOR SLEEP., Disp: 90 tablet, Rfl: 2  No Known Allergies  Objective:   VITALS: Per patient if applicable, see vitals. GENERAL: Alert, appears well and in no acute distress. HEENT: Atraumatic, conjunctiva clear, no obvious abnormalities on inspection of external nose and ears. NECK: Normal movements of the head and neck. CARDIOPULMONARY: No increased WOB. Speaking in clear sentences. I:E ratio WNL.  MS: Moves all visible extremities without noticeable abnormality. PSYCH: Pleasant and cooperative, well-groomed. Speech normal rate and rhythm. Affect is appropriate. Insight and judgement are appropriate. Attention is focused, linear, and appropriate.  NEURO: CN grossly intact. Oriented as arrived to appointment on time with no prompting. Moves both UE equally.  SKIN: No obvious lesions, wounds, erythema, or cyanosis noted on face or hands.  Depression screen [facility] 2/9 11/10/2018 02/20/2017  Decreased Interest 0 0  Down, Depressed, Hopeless 0 0  PHQ - 2 Score 0 0  Altered sleeping 0 -  Tired, decreased energy 0 -  Change in appetite 0 -  Feeling bad or failure about yourself  0 -  Trouble concentrating 0 -  Moving slowly or fidgety/restless 0 -  Suicidal thoughts 0 -  PHQ-9 Score 0 -  Difficult doing work/chores Not difficult at all -  Assessment and Plan:   Diagnoses and all orders for this visit:  Attention deficit hyperactivity disorder (ADHD), combined type    . COVID-19 Education: The signs and symptoms of COVID-19 were discussed with the patient and how to seek care for testing if needed. The importance of social  distancing was discussed today. . Reviewed expectations re: course of current medical issues. . Discussed self-management of symptoms. . Outlined signs and symptoms indicating need for more acute intervention. . Patient verbalized understanding and all questions were answered. Marland Kitchen. Health Maintenance issues including appropriate healthy diet, exercise, and smoking avoidance were discussed with patient. . See orders for this visit as documented in the electronic medical record.  Stephen Lawrence , CMA  Records requested if needed. Time spent: *** minutes, of which >50% was spent in obtaining information about his symptoms, reviewing his previous labs, evaluations, and treatments, counseling him about his condition (please see the discussed topics above), and developing a plan to further investigate it; he had a number of questions which I addressed.

## 2019-07-23 NOTE — Telephone Encounter (Signed)
See note  Copied from [address] [id number]. Topic: Appointment Scheduling - Scheduling Inquiry for Clinic >> Jul 23, 2019 10:01 AM Virl Axe D wrote: Reason for CRM: Pt would like to know if his appt tomorrow is necessary. Stated he has not had to do 3 month follow ups in the past. Requesting CB. Please advise.

## 2019-07-23 NOTE — Progress Notes (Deleted)
Stephen Lawrence is a 33 y.o. male is here for follow up.  History of Present Illness:   (SCRIBE ATTESTATION)  HPI:   Health Maintenance Due  Topic Date Due  . INFLUENZA VACCINE  06/20/2019   Depression screen [facility] 2/9 11/10/2018 02/20/2017  Decreased Interest 0 0  Down, Depressed, Hopeless 0 0  PHQ - 2 Score 0 0  Altered sleeping 0 -  Tired, decreased energy 0 -  Change in appetite 0 -  Feeling bad or failure about yourself  0 -  Trouble concentrating 0 -  Moving slowly or fidgety/restless 0 -  Suicidal thoughts 0 -  PHQ-9 Score 0 -  Difficult doing work/chores Not difficult at all -   PMHx, SurgHx, SocialHx, FamHx, Medications, and Allergies were reviewed in the Visit Navigator and updated as appropriate.   Patient Active Problem List   Diagnosis Date Noted  . Class 1 obesity due to excess calories without serious comorbidity with body mass index (BMI) of 33.0 to 33.9 in adult 02/23/2017  . Low vitamin D level 02/23/2017  . ADD (attention deficit disorder) 03/22/2015   Social History   Tobacco Use  . Smoking status: Never Smoker  . Smokeless tobacco: Never Used  Substance Use Topics  . Alcohol use: Yes    Comment: Socially  . Drug use: No   Current Medications and Allergies   Current Outpatient Medications:  .  amphetamine-dextroamphetamine (ADDERALL) 20 MG tablet, Take 1 tablet (20 mg total) by mouth 2 (two) times daily., Disp: 180 tablet, Rfl: 0 .  amphetamine-dextroamphetamine (ADDERALL) 20 MG tablet, Take 1 tablet (20 mg total) by mouth 2 (two) times daily., Disp: 180 tablet, Rfl: 0 .  clotrimazole-betamethasone (LOTRISONE) cream, Apply 1 application topically 2 (two) times daily., Disp: 30 g, Rfl: 0 .  ofloxacin (FLOXIN OTIC) 0.3 % OTIC solution, Place 5 drops into the left ear daily., Disp: 5 mL, Rfl: 0 .  traZODone (DESYREL) 50 MG tablet, TAKE 0.5-1 TABLETS (25-50 MG TOTAL) BY MOUTH AT BEDTIME AS NEEDED FOR SLEEP., Disp: 90 tablet, Rfl: 2  No Known  Allergies Review of Systems   Pertinent items are noted in the HPI. Otherwise, a complete ROS is negative.  Vitals  There were no vitals filed for this visit.   There is no height or weight on file to calculate BMI.  Physical Exam   Physical Exam  Results for orders placed or performed in visit on 11/10/18  Comprehensive metabolic panel  Result Value Ref Range   Sodium 141 135 - 145 mEq/L   Potassium 4.1 3.5 - 5.1 mEq/L   Chloride 106 96 - 112 mEq/L   CO2 27 19 - 32 mEq/L   Glucose, Bld 85 70 - 99 mg/dL   BUN 14 6 - 23 mg/dL   Creatinine, Ser 0.97 0.40 - 1.50 mg/dL   Total Bilirubin 0.3 0.2 - 1.2 mg/dL   Alkaline Phosphatase 63 39 - 117 U/L   AST 14 0 - 37 U/L   ALT 17 0 - 53 U/L   Total Protein 6.4 6.0 - 8.3 g/dL   Albumin 4.3 3.5 - 5.2 g/dL   Calcium 9.3 8.4 - 10.5 mg/dL   GFR 96.16 >60.00 mL/min  Lipid panel  Result Value Ref Range   Cholesterol 160 0 - 200 mg/dL   Triglycerides 58.0 0.0 - 149.0 mg/dL   HDL 45.40 >39.00 mg/dL   VLDL 11.6 0.0 - 40.0 mg/dL   LDL Cholesterol 103 (H) 0 -  99 mg/dL   Total CHOL/HDL Ratio 4    NonHDL 114.27   POCT glycosylated hemoglobin (Hb A1C)  Result Value Ref Range   Hemoglobin A1C 4.8 4.0 - 5.6 %   HbA1c POC (<> result, manual entry)     HbA1c, POC (prediabetic range)     HbA1c, POC (controlled diabetic range)      Assessment and Plan   There are no diagnoses linked to this encounter.  . Orders and follow up as documented in EpicCare, reviewed diet, exercise and weight control, cardiovascular risk and specific lipid/LDL goals reviewed, reviewed medications and side effects in detail.  . Reviewed expectations re: course of current medical issues. . Outlined signs and symptoms indicating need for more acute intervention. . Patient verbalized understanding and all questions were answered. . Patient received an After Visit Summary.  *** CMA served as Neurosurgeonscribe during this visit. History, Physical, and Plan performed by medical  provider. The above documentation has been reviewed and is accurate and complete. Helane RimaErica Odis Wickey, D.O.  Helane RimaErica Amee Boothe, DO [place] 07/23/2019

## 2019-07-24 ENCOUNTER — Ambulatory Visit: Payer: BLUE CROSS/BLUE SHIELD | Admitting: Family Medicine

## 2019-07-30 NOTE — Progress Notes (Signed)
Virtual Visit via Video   Due to the COVID-19 pandemic, this visit was completed with telemedicine (audio/video) technology to reduce patient and provider exposure as well as to preserve personal protective equipment.   I connected with Claiborne RiggDustin R Coffield by a video enabled telemedicine application and verified that I am speaking with the correct person using two identifiers. Location patient: Home Location provider: [facility], Office Persons participating in the virtual visit: Perley JainDustin R Jowers, Tuvia Woodrick, DO   I discussed the limitations of evaluation and management by telemedicine and the availability of in person appointments. The patient expressed understanding and agreed to proceed.  Care Team   Patient Care Team: Helane RimaWallace, Orva Riles, DO as PCP - General (Family Medicine)  Subjective:   HPI: Patient has been taking CBD products to help with sleeping. He does have Trazodone but gives him nightmares. He is taking the Adderall 20mg  twice a day with no issues.   Since the last visit has the patient had any:  Appetite changes? No Unintentional weight loss? No Is medication working well ? Yes Does patient take drug holidays? No Difficulties falling to sleep or maintaining sleep? No Any anxiety?  No Any cardiac issues (fainting or paliptations)? No Suicidal thoughts? No Changes in health since last visit? No New medications? No Any illicit substance abuse? No Has the patient taken his medication today? Yes  Review of Systems  Constitutional: Negative for chills and fever.  HENT: Negative for hearing loss and tinnitus.   Eyes: Negative for blurred vision and double vision.  Respiratory: Negative for cough and wheezing.   Cardiovascular: Negative for chest pain, palpitations and leg swelling.  Gastrointestinal: Negative for heartburn and nausea.  Genitourinary: Negative for dysuria and urgency.  Musculoskeletal: Negative for myalgias and neck pain.  Neurological: Negative  for dizziness and headaches.    Patient Active Problem List   Diagnosis Date Noted  . Class 1 obesity due to excess calories without serious comorbidity with body mass index (BMI) of 33.0 to 33.9 in adult 02/23/2017  . Low vitamin D level 02/23/2017  . ADD (attention deficit disorder) 03/22/2015    Social History   Tobacco Use  . Smoking status: Never Smoker  . Smokeless tobacco: Never Used  Substance Use Topics  . Alcohol use: Yes    Comment: Socially    Current Outpatient Medications:  .  clotrimazole-betamethasone (LOTRISONE) cream, Apply 1 application topically 2 (two) times daily., Disp: 30 g, Rfl: 0 .  amphetamine-dextroamphetamine (ADDERALL) 20 MG tablet, Take 1 tablet (20 mg total) by mouth 2 (two) times daily., Disp: 180 tablet, Rfl: 0  Allergies  Allergen Reactions  . Trazodone And Nefazodone Other (See Comments)    Night terrors    Objective:   VITALS: Per patient if applicable, see vitals. GENERAL: Alert, appears well and in no acute distress. HEENT: Atraumatic, conjunctiva clear, no obvious abnormalities on inspection of external nose and ears. NECK: Normal movements of the head and neck. CARDIOPULMONARY: No increased WOB. Speaking in clear sentences. I:E ratio WNL.  MS: Moves all visible extremities without noticeable abnormality. PSYCH: Pleasant and cooperative, well-groomed. Speech normal rate and rhythm. Affect is appropriate. Insight and judgement are appropriate. Attention is focused, linear, and appropriate.  NEURO: CN grossly intact. Oriented as arrived to appointment on time with no prompting. Moves both UE equally.  SKIN: No obvious lesions, wounds, erythema, or cyanosis noted on face or hands.  [facility]/10[facility] 02/20/2017  Decreased Interest 0 0  Down,  Depressed, Hopeless 0 0  PHQ - 2 Score 0 0  Altered sleeping 0 -  Tired, decreased energy 0 -  Change in appetite 0 -  Feeling bad or failure about yourself  0 -  Trouble  concentrating 0 -  Moving slowly or fidgety/restless 0 -  Suicidal thoughts 0 -  PHQ-9 Score 0 -  Difficult doing work/chores Not difficult at all -    Assessment and Plan:   Alvar was seen today for medication refill.  Diagnoses and all orders for this visit:  Attention deficit hyperactivity disorder (ADHD), combined type -     amphetamine-dextroamphetamine (ADDERALL) 20 MG tablet; Take 1 tablet (20 mg total) by mouth 2 (two) times daily. -     Ambulatory referral to Family Practice  Class 1 obesity due to excess calories without serious comorbidity with body mass index (BMI) of 33.0 to 33.9 in adult -     Ambulatory referral to Family Practice  Low vitamin D level -     Ambulatory referral to [facility]   . COVID-19 Education: The signs and symptoms of COVID-19 were discussed with the patient and how to seek care for testing if needed. The importance of social distancing was discussed today. . Reviewed expectations re: course of current medical issues. . Discussed self-management of symptoms. . Outlined signs and symptoms indicating need for more acute intervention. . Patient verbalized understanding and all questions were answered. Marland Kitchen Health Maintenance issues including appropriate healthy diet, exercise, and smoking avoidance were discussed with patient. . See orders for this visit as documented in the electronic medical record.  Briscoe Deutscher, DO  Records requested if needed. Time spent: 25 minutes, of which >50% was spent in obtaining information about his symptoms, reviewing his previous labs, evaluations, and treatments, counseling him about his condition (please see the discussed topics above), and developing a plan to further investigate it; he had a number of questions which I addressed.

## 2019-07-31 ENCOUNTER — Ambulatory Visit (INDEPENDENT_AMBULATORY_CARE_PROVIDER_SITE_OTHER): Payer: BLUE CROSS/BLUE SHIELD | Admitting: Family Medicine

## 2019-07-31 ENCOUNTER — Encounter: Payer: Self-pay | Admitting: Family Medicine

## 2019-07-31 VITALS — Ht 70.0 in | Wt 249.0 lb

## 2019-07-31 DIAGNOSIS — E6609 Other obesity due to excess calories: Secondary | ICD-10-CM | POA: Diagnosis not present

## 2019-07-31 DIAGNOSIS — Z6833 Body mass index (BMI) 33.0-33.9, adult: Secondary | ICD-10-CM | POA: Diagnosis not present

## 2019-07-31 DIAGNOSIS — R7989 Other specified abnormal findings of blood chemistry: Secondary | ICD-10-CM | POA: Diagnosis not present

## 2019-07-31 DIAGNOSIS — F902 Attention-deficit hyperactivity disorder, combined type: Secondary | ICD-10-CM | POA: Diagnosis not present

## 2019-07-31 MED ORDER — AMPHETAMINE-DEXTROAMPHETAMINE 20 MG PO TABS: 20.0000 mg | ORAL_TABLET | Freq: Two times a day (BID) | ORAL | 0 refills | Status: DC

## 2019-11-10 ENCOUNTER — Encounter: Payer: Self-pay | Admitting: Osteopathic Medicine

## 2019-11-10 ENCOUNTER — Ambulatory Visit (INDEPENDENT_AMBULATORY_CARE_PROVIDER_SITE_OTHER): Payer: BLUE CROSS/BLUE SHIELD | Admitting: Osteopathic Medicine

## 2019-11-10 ENCOUNTER — Other Ambulatory Visit: Payer: Self-pay

## 2019-11-10 VITALS — BP 128/77 | HR 83 | Temp 98.1°F | Ht 70.0 in | Wt 256.1 lb

## 2019-11-10 DIAGNOSIS — Z0000 Encounter for general adult medical examination without abnormal findings: Secondary | ICD-10-CM

## 2019-11-10 DIAGNOSIS — Z23 Encounter for immunization: Secondary | ICD-10-CM

## 2019-11-10 DIAGNOSIS — F902 Attention-deficit hyperactivity disorder, combined type: Secondary | ICD-10-CM | POA: Diagnosis not present

## 2019-11-10 MED ORDER — AMPHETAMINE-DEXTROAMPHETAMINE 20 MG PO TABS: 20.0000 mg | ORAL_TABLET | Freq: Two times a day (BID) | ORAL | 0 refills | Status: DC

## 2019-11-10 NOTE — Patient Instructions (Signed)
Let me know when due for refill son ADHD Rx!

## 2019-11-10 NOTE — Progress Notes (Signed)
HPI: Stephen Lawrence is a 33 y.o. male who  has a past medical history of ADD (attention deficit disorder), Class 1 obesity due to excess calories without serious comorbidity with body mass index (BMI) of 33.0 to 33.9 in adult (02/23/2017), and Low vitamin D level (02/23/2017).  he presents to [facility] today, 11/10/19,  for chief complaint of: New to establish care  ADHD refills   Pleasant new patient here to establish care Moving practices d/t location / previous PCP leaving that practice No major concerns today!   ADHD:  Adderall 20 mg bid works well for him Has been on Adderall at a few different doses over the years Occasionally will try coming off the Rx for a week or so        Past medical, surgical, social and family history reviewed:  Patient Active Problem List   Diagnosis Date Noted  . Class 1 obesity due to excess calories without serious comorbidity with body mass index (BMI) of 33.0 to 33.9 in adult 02/23/2017  . Low vitamin D level 02/23/2017  . ADD (attention deficit disorder) 03/22/2015    Past Surgical History:  Procedure Laterality Date  . VASECTOMY  2016  . WISDOM TOOTH EXTRACTION  2005    Social History   Tobacco Use  . Smoking status: Never Smoker  . Smokeless tobacco: Never Used  Substance Use Topics  . Alcohol use: Yes    Comment: Socially    Family History  Problem Relation Age of Onset  . Meniere's disease Father      Current medication list and allergy/intolerance information reviewed:    Current Outpatient Medications  Medication Sig Dispense Refill  . amphetamine-dextroamphetamine (ADDERALL) 20 MG tablet Take 1 tablet (20 mg total) by mouth 2 (two) times daily. 180 tablet 0  . clotrimazole-betamethasone (LOTRISONE) cream Apply 1 application topically 2 (two) times daily. (Patient not taking: Reported on 11/10/2019) 30 g 0   No current facility-administered medications for this visit.     Allergies  Allergen Reactions  . Trazodone And Nefazodone Other (See Comments)    Night terrors      Review of Systems:  Constitutional:  No  fever, no chills, No recent illness, No unintentional weight changes. No significant fatigue.   HEENT: No  headache, no vision change, no hearing change, No sore throat, No  sinus pressure  Cardiac: No  chest pain, No  pressure, No palpitations, No  Orthopnea  Respiratory:  No  shortness of breath. No  Cough  Gastrointestinal: No  abdominal pain, No  nausea, No  vomiting,  No  blood in stool, No  diarrhea, No  constipation   Musculoskeletal: No new myalgia/arthralgia  Skin: No  Rash, No other wounds/concerning lesions  Genitourinary: No  incontinence, No  abnormal genital bleeding, No abnormal genital discharge  Hem/Onc: No  easy bruising/bleeding, No  abnormal lymph node  Endocrine: No cold intolerance,  No heat intolerance. No polyuria/polydipsia/polyphagia    Neurologic: No  weakness, No  dizziness, No  slurred speech/focal weakness/facial droop  Psychiatric: No  concerns with depression, No  concerns with anxiety, No sleep problems, No mood problems  Exam:  BP 128/77 (BP Location: Left Arm, Patient Position: Sitting, Cuff Size: Normal)   Pulse 83   Temp 98.1 F (36.7 C) (Oral)   Ht 5\' 10"  (1.778 m)   Wt 256 lb 1.6 oz (116.2 kg)   BMI 36.75 kg/m   Constitutional: VS see above. General Appearance:  alert, well-developed, well-nourished, NAD  Eyes: Normal lids and conjunctive, non-icteric sclera  Ears, Nose, Mouth, Throat: TM normal bilaterally.   Neck: No masses, trachea midline. No thyroid enlargement. No tenderness/mass appreciated. No lymphadenopathy  Respiratory: Normal respiratory effort. no wheeze, no rhonchi, no rales  Cardiovascular: S1/S2 normal, no murmur, no rub/gallop auscultated. RRR. No lower extremity edema.  Gastrointestinal: Nontender, no masses. No hepatomegaly, no splenomegaly. No hernia  appreciated. Bowel sounds normal. Rectal exam deferred.   Musculoskeletal: Gait normal. No clubbing/cyanosis of digits.   Neurological: Normal balance/coordination. No tremor.  Skin: warm, dry, intact. No rash/ulcer.   Psychiatric: Normal judgment/insight. Normal mood and affect. Oriented x3.    No results found for this or any previous visit (from the past 72 hour(s)).  No results found.   ASSESSMENT/PLAN: The primary encounter diagnosis was Attention deficit hyperactivity disorder (ADHD), combined type. Diagnoses of Need for influenza vaccination and Annual physical exam were also pertinent to this visit.   Labs ordered for future visit. Annual physical / preventive care was NOT performed or billed today.    Orders Placed This Encounter  Procedures  . Flu Vaccine QUAD 6+ mos PF IM (Fluarix Quad PF)  . CBC  . COMPLETE METABOLIC PANEL WITH GFR  . LIPID SCREENING    Meds ordered this encounter  Medications  . amphetamine-dextroamphetamine (ADDERALL) 20 MG tablet    Sig: Take 1 tablet (20 mg total) by mouth 2 (two) times daily.    Dispense:  180 tablet    Refill:  0    Patient Instructions  Let me know when due for refill son ADHD Rx!        Visit summary with medication list and pertinent instructions was printed for patient to review. All questions at time of visit were answered - patient instructed to contact office with any additional concerns or updates. ER/RTC precautions were reviewed with the patient.   Note: Total time spent 30 minutes, greater than 50% of the visit was spent face-to-face counseling and coordinating care for the above diagnoses listed in assessment/plan.   Please note: voice recognition software was used to produce this document, and typos may escape review. Please contact Dr. Lyn Hollingshead for any needed clarifications.     Follow-up plan: Return in about 6 months (around 05/10/2020) for [place] (get labs prior to visit, orders are in).OK to  refill meds until that time

## 2020-01-17 DIAGNOSIS — R05 Cough: Secondary | ICD-10-CM | POA: Diagnosis not present

## 2020-01-17 DIAGNOSIS — J3489 Other specified disorders of nose and nasal sinuses: Secondary | ICD-10-CM | POA: Diagnosis not present

## 2020-01-17 DIAGNOSIS — Z20822 Contact with and (suspected) exposure to covid-19: Secondary | ICD-10-CM | POA: Diagnosis not present

## 2020-01-17 DIAGNOSIS — R0981 Nasal congestion: Secondary | ICD-10-CM | POA: Diagnosis not present

## 2020-02-06 ENCOUNTER — Other Ambulatory Visit: Payer: Self-pay | Admitting: Osteopathic Medicine

## 2020-02-06 DIAGNOSIS — F902 Attention-deficit hyperactivity disorder, combined type: Secondary | ICD-10-CM

## 2020-02-08 MED ORDER — AMPHETAMINE-DEXTROAMPHETAMINE 20 MG PO TABS: 20.0000 mg | ORAL_TABLET | Freq: Two times a day (BID) | ORAL | 0 refills | Status: DC

## 2020-05-09 ENCOUNTER — Other Ambulatory Visit: Payer: Self-pay | Admitting: Osteopathic Medicine

## 2020-05-09 DIAGNOSIS — F902 Attention-deficit hyperactivity disorder, combined type: Secondary | ICD-10-CM

## 2020-05-09 MED ORDER — AMPHETAMINE-DEXTROAMPHETAMINE 20 MG PO TABS: 20.0000 mg | ORAL_TABLET | Freq: Two times a day (BID) | ORAL | 0 refills | Status: DC

## 2020-05-09 NOTE — Telephone Encounter (Signed)
[facility] requesting med refill for adderall. Last written on 02/08/20.

## 2020-05-20 ENCOUNTER — Telehealth: Payer: Self-pay | Admitting: Osteopathic Medicine

## 2020-05-20 ENCOUNTER — Encounter: Payer: BLUE CROSS/BLUE SHIELD | Admitting: Osteopathic Medicine

## 2020-05-20 NOTE — Telephone Encounter (Signed)
Patient called in at 8am to cancel and reschedule appointment.

## 2020-05-25 ENCOUNTER — Encounter: Payer: Self-pay | Admitting: Osteopathic Medicine

## 2020-05-25 ENCOUNTER — Other Ambulatory Visit: Payer: Self-pay

## 2020-05-25 ENCOUNTER — Ambulatory Visit (INDEPENDENT_AMBULATORY_CARE_PROVIDER_SITE_OTHER): Payer: BLUE CROSS/BLUE SHIELD | Admitting: Osteopathic Medicine

## 2020-05-25 VITALS — BP 125/80 | HR 72 | Temp 97.0°F | Ht 70.0 in | Wt 257.0 lb

## 2020-05-25 DIAGNOSIS — Z0000 Encounter for general adult medical examination without abnormal findings: Secondary | ICD-10-CM

## 2020-05-25 LAB — CBC
HCT: 41.6 % (ref 38.5–50.0)
Hemoglobin: 14.3 g/dL (ref 13.2–17.1)
MCH: 30 pg (ref 27.0–33.0)
MCHC: 34.4 g/dL (ref 32.0–36.0)
MCV: 87.4 fL (ref 80.0–100.0)
MPV: 10.9 fL (ref 7.5–12.5)
Platelets: 311 10*3/uL (ref 140–400)
RBC: 4.76 10*6/uL (ref 4.20–5.80)
RDW: 12.5 % (ref 11.0–15.0)
WBC: 6.6 10*3/uL (ref 3.8–10.8)

## 2020-05-25 LAB — LIPID PANEL
Cholesterol: 175 mg/dL (ref <200)
HDL: 46 mg/dL (ref >=40)
LDL Cholesterol (Calc): 113 mg/dL (calc) — ABNORMAL HIGH
Non-HDL Cholesterol (Calc): 129 mg/dL (calc) (ref <130)
Total CHOL/HDL Ratio: 3.8 (calc) (ref <5.0)
Triglycerides: 73 mg/dL (ref <150)

## 2020-05-25 LAB — COMPLETE METABOLIC PANEL WITH GFR
AG Ratio: 2 (calc) (ref 1.0–2.5)
ALT: 16 U/L (ref 9–46)
AST: 16 U/L (ref 10–40)
Albumin: 4.3 g/dL (ref 3.6–5.1)
Alkaline phosphatase (APISO): 70 U/L (ref 36–130)
BUN: 12 mg/dL (ref 7–25)
CO2: 28 mmol/L (ref 20–32)
Calcium: 9.6 mg/dL (ref 8.6–10.3)
Chloride: 106 mmol/L (ref 98–110)
Creat: 0.96 mg/dL (ref 0.60–1.35)
GFR, Est African American: 121 mL/min/{1.73_m2} (ref >=60)
GFR, Est Non African American: 104 mL/min/{1.73_m2} (ref >=60)
Globulin: 2.1 g/dL (calc) (ref 1.9–3.7)
Glucose, Bld: 87 mg/dL (ref 65–99)
Potassium: 4.5 mmol/L (ref 3.5–5.3)
Sodium: 141 mmol/L (ref 135–146)
Total Bilirubin: 0.3 mg/dL (ref 0.2–1.2)
Total Protein: 6.4 g/dL (ref 6.1–8.1)

## 2020-05-25 NOTE — Patient Instructions (Signed)
General Preventive Care  Most recent routine screening labs: ordered today.   Blood pressure goal 130/80 or less.   Tobacco: don't!   Alcohol: responsible moderation is ok for most adults - if you have concerns about your alcohol intake, please talk to me!   Exercise: as tolerated to reduce risk of cardiovascular disease and diabetes. Strength training will also prevent osteoporosis.   Mental health: if need for mental health care (medicines, counseling, other), or concerns about moods, please let me know!   Sexual / Reproductive health: if need for STD testing, or if concerns with libido/pain problems, please let me know!   Advanced Directive: Living Will and/or Healthcare Power of Attorney recommended for all adults, regardless of age or health.  Vaccines  Flu vaccine: for almost everyone, every fall.   Shingles vaccine: after age 34.   Pneumonia vaccines: after age 52  Tetanus booster: every 10 years, due 2025  HPV vaccine: Gardasil up to age 63 to prevent HPV-associated diseases, including certain cancers.   COVID vaccine: STRONGLY RECOMMENDED  Cancer screenings   Colon cancer screening: for everyone age 77-75.   Prostate cancer screening: PSA blood test age 44-71  Lung cancer screening: not needed for non-smokers  Infection screenings  . HIV: recommended screening at least once age 6-65 . Gonorrhea/Chlamydia: screening as needed . Hepatitis C: recommended once for everyone age 48-75 . TB: certain at-risk populations, or depending on work requirements and/or travel history Other . Bone Density Test: recommended for men at age 82 . Abdominal Aortic Aneurysm: screening with ultrasound recommended once for men age 33-75 who have ever smoked

## 2020-05-25 NOTE — Progress Notes (Signed)
Stephen Lawrence is a 34 y.o. male who presents to  [facility]  today, 05/25/20, seeking care for the following:  . Annual Physical / 6 mos check for ADHD Rx      ASSESSMENT & PLAN with other pertinent findings:  The encounter diagnosis was Annual physical exam.   No results found for this or any previous visit (from the past 24 hour(s)).   Patient Instructions  General Preventive Care  Most recent routine screening labs: ordered today.   Blood pressure goal 130/80 or less.   Tobacco: don't!   Alcohol: responsible moderation is ok for most adults - if you have concerns about your alcohol intake, please talk to me!   Exercise: as tolerated to reduce risk of cardiovascular disease and diabetes. Strength training will also prevent osteoporosis.   Mental health: if need for mental health care (medicines, counseling, other), or concerns about moods, please let me know!   Sexual / Reproductive health: if need for STD testing, or if concerns with libido/pain problems, please let me know!   Advanced Directive: Living Will and/or Healthcare Power of Attorney recommended for all adults, regardless of age or health.  Vaccines  Flu vaccine: for almost everyone, every fall.   Shingles vaccine: after age 39.   Pneumonia vaccines: after age 24  Tetanus booster: every 10 years, due 2025  HPV vaccine: Gardasil up to age 62 to prevent HPV-associated diseases, including certain cancers.   COVID vaccine: STRONGLY RECOMMENDED  Cancer screenings   Colon cancer screening: for everyone age 31-75.   Prostate cancer screening: PSA blood test age 37-71  Lung cancer screening: not needed for non-smokers  Infection screenings  . HIV: recommended screening at least once age 6-65 . Gonorrhea/Chlamydia: screening as needed . Hepatitis C: recommended once for everyone age 57-75 . TB: certain at-risk populations, or depending on work  requirements and/or travel history Other . Bone Density Test: recommended for men at age 25 . Abdominal Aortic Aneurysm: screening with ultrasound recommended once for men age 34-75 who have ever smoked     Orders Placed This Encounter  Procedures  . CBC  . COMPLETE METABOLIC PANEL WITH GFR  . Lipid panel    No orders of the defined types were placed in this encounter.  Constitutional:  . VSS, see nurse notes . General Appearance: alert, well-developed, well-nourished, NAD Eyes: Marland Kitchen Normal lids and conjunctive, non-icteric sclera . PERRLA Ears, Nose, Mouth, Throat: . Normal appearance Neck: . No masses, trachea midline . No thyroid enlargement/tenderness/mass appreciated Respiratory: . Normal respiratory effort . Breath sounds normal, no wheeze/rhonchi/rales Cardiovascular: . S1/S2 normal, no murmur/rub/gallop auscultated . No lower extremity edema Gastrointestinal: . Nontender, no masses . No hepatomegaly, no splenomegaly . No hernia appreciated Musculoskeletal:  . Gait normal . No clubbing/cyanosis of digits Neurological: . No cranial nerve deficit on limited exam . Motor and sensation intact and symmetric Psychiatric: . Normal judgment/insight . Normal mood and affect     Follow-up instructions: Return in about 6 months (around 11/25/2020) for MONITOR BP TO MAINTAIN RX FILLS FOR ADD .OK to refill until then!                                          BP 138/79 (BP Location: Left Arm, Patient Position: Sitting)   Pulse 72   Temp (!) 97  F (36.1 C)   Ht 5\' 10"  (1.778 m)   Wt 257 lb (116.6 kg)   SpO2 99%   BMI 36.88 kg/m   Current Meds  Medication Sig  . amphetamine-dextroamphetamine (ADDERALL) 20 MG tablet Take 1 tablet (20 mg total) by mouth 2 (two) times daily.  MELATONIN GUMMIES PO Take by mouth.  . Multiple Vitamin (ONE-A-DAY MENS PO) Take by mouth.    No results found for this or any previous visit (from  the past 72 hour(s)).  No results found.     All questions at time of visit were answered - patient instructed to contact office with any additional concerns or updates.  ER/RTC precautions were reviewed with the patient as applicable.   Please note: voice recognition software was used to produce this document, and typos may escape review. Please contact Dr. Marland Kitchen for any needed clarifications.

## 2020-08-11 ENCOUNTER — Other Ambulatory Visit: Payer: Self-pay | Admitting: Osteopathic Medicine

## 2020-08-11 DIAGNOSIS — F902 Attention-deficit hyperactivity disorder, combined type: Secondary | ICD-10-CM

## 2020-08-11 MED ORDER — AMPHETAMINE-DEXTROAMPHETAMINE 20 MG PO TABS: 20.0000 mg | ORAL_TABLET | Freq: Two times a day (BID) | ORAL | 0 refills | Status: DC

## 2020-08-11 NOTE — Telephone Encounter (Signed)
Routing to covering provider.  °

## 2020-09-22 DIAGNOSIS — Z20822 Contact with and (suspected) exposure to covid-19: Secondary | ICD-10-CM | POA: Diagnosis not present

## 2020-09-22 DIAGNOSIS — Z03818 Encounter for observation for suspected exposure to other biological agents ruled out: Secondary | ICD-10-CM | POA: Diagnosis not present

## 2020-11-08 ENCOUNTER — Other Ambulatory Visit: Payer: Self-pay | Admitting: Family Medicine

## 2020-11-08 DIAGNOSIS — F902 Attention-deficit hyperactivity disorder, combined type: Secondary | ICD-10-CM

## 2020-11-09 MED ORDER — AMPHETAMINE-DEXTROAMPHETAMINE 20 MG PO TABS: 20.0000 mg | ORAL_TABLET | Freq: Two times a day (BID) | ORAL | 0 refills | Status: DC

## 2020-11-15 ENCOUNTER — Encounter: Payer: Self-pay | Admitting: Osteopathic Medicine

## 2020-11-15 ENCOUNTER — Telehealth (INDEPENDENT_AMBULATORY_CARE_PROVIDER_SITE_OTHER): Payer: BLUE CROSS/BLUE SHIELD | Admitting: Osteopathic Medicine

## 2020-11-15 VITALS — Temp 97.6°F | Wt 255.0 lb

## 2020-11-15 DIAGNOSIS — U071 COVID-19: Secondary | ICD-10-CM

## 2020-11-15 NOTE — Patient Instructions (Addendum)
Over-the-Counter Medications & Home Remedies for Upper Respiratory Illness  Note: the following list assumes no pregnancy, normal liver & kidney function and no other drug interactions. Dr. Lyn Hollingshead has highlighted medications which are safe for you to use, but these may not be appropriate for everyone. Always ask a pharmacist or qualified medical provider if you have any questions!   Aches/Pains, Fever, Headache Acetaminophen (Tylenol) 500 mg tablets - take max 2 tablets (1000 mg) every 6 hours (4 times per day)  Ibuprofen (Motrin) 200 mg tablets - take max 4 tablets (800 mg) every 6 hours*  Sinus Congestion Prescription Atrovent as directed Cromolyn Nasal Spray (NasalCrom) 1 spray each nostril 3-4 times per day, max 6 imes per day Nasal Saline if desired Oxymetolazone (Afrin, others) sparing use due to rebound congestion, NEVER use in kids Phenylephrine (Sudafed) 10 mg tablets every 4 hours (or the 12-hour formulation)* Diphenhydramine (Benadryl) 25 mg tablets - take max 2 tablets every 4 hours  Cough & Sore Throat Prescription cough pills or syrups as directed Dextromethorphan (Robitussin, others) - cough suppressant Guaifenesin (Robitussin, Mucinex, others) - expectorant (helps cough up mucus) (Dextromethorphan and Guaifenesin also come in a combination tablet) Lozenges w/ Benzocaine + Menthol (Cepacol) Honey - as much as you want! Teas which "coat the throat" - look for ingredients Elm Bark, Licorice Root, Marshmallow Root  Other Antibiotics if these are prescribed - take ALL, even if you're feeling better  Zinc Lozenges within 24 hours of symptoms onset - mixed evidence this shortens the duration of the common cold Don't waste your money on Vitamin C or Echinacea  *Caution in patients with high blood pressure   What should I do if my child was exposed to someone with COVID-19?--If your child was in close contact with someone with COVID-19, what to do next depends on  whether your child has already had COVID-19 or gotten the vaccine:  ?If your child has not had COVID-19 or gotten the vaccine - They should get tested if possible, even if they don't have any symptoms. Call their doctor or nurse if you aren't sure where to get a test. Whether or not your child is tested, they should self-quarantine at home after an exposure. This means staying home and away from other people as much as possible. The safest thing to do is to self-quarantine for 14 days. Some public health departments might allow people to stop quarantining sooner, especially if they get a negative test. If you're not sure how long your child should quarantine for, contact your local public health office or ask your child's doctor or nurse.  ?If your child has had COVID-19 or gotten the vaccine - If your child had COVID-19 within the last 3 months, they do not need to self-quarantine. If they had COVID-19 but it was more than 3 months ago, follow the steps above. If your child is fully vaccinated, they do not need to self-quarantine. But they should still get tested 3 to 5 days after they were in contact with the person who had COVID-19. Even though they are much less likely to get the infection after being vaccinated, it is still possible.  If your child self-quarantines for less than 14 days, or if they do not need to self-quarantine, you should still monitor them for symptoms for the full 14 days. If they start to have any symptoms, call their doctor or nurse right away. Your child should also be extra careful about wearing a mask and social distancing  during this time. In certain cases, doctors might recommend treatment with something called "monoclonal antibodies." This might lower the risk of getting infected after exposure. It might be an option for some children who are at risk for getting seriously ill and have not been vaccinated. Examples include children under the age of 1 year or with certain  medical conditions. It might also be recommended for children who are vaccinated but whose immune system is weaker than normal. You can ask your doctor or nurse if your child should get this treatment.

## 2020-11-15 NOTE — Progress Notes (Signed)
Telemedicine Visit via  Video & Audio (App used: MyChart)    I connected with Stephen Lawrence on 11/15/20 at 2:32 PM  by phone or  telemedicine application as noted above  I verified that I am speaking with or regarding  the correct patient using two identifiers.  Participants: Myself, Dr Sunnie Nielsen DO Patient: Stephen Lawrence Patient proxy if applicable: none Other, if applicable: none  Patient is in separate location from myself  I am in office at [facility]    I discussed the limitations of evaluation and management  by telemedicine and the availability of in person appointments.  The participant(s) above expressed understanding and  agreed to proceed with this appointment via telemedicine.       History of Present Illness: Stephen Lawrence is a 34 y.o. male who would like to discuss COVID    Symptoms onset 11/08/20 Brother tested (+) COVID  Yesterday was Day 6 and this was the first time he spiked a fev Mucinex, Advil, Zinc, Multivitamin       Observations/Objective: Temp 97.6 F (36.4 C)   Wt 255 lb (115.7 kg)   BMI 36.59 kg/m  BP Readings from Last 3 Encounters:  05/26/20 125/80  11/10/19 128/77  06/15/19 128/74   Exam: Normal Speech.  NAD  Lab and Radiology Results No results found for this or any previous visit (from the past 72 hour(s)). No results found.     Assessment and Plan: 33 y.o. male with The encounter diagnosis was COVID-19.  Discussed supportive care, reasons to seek emergency medical attention. He seems to be improving, hopefully can end isolation if no fever for 3 days   See below for list of OTC meds/remedies and how to handle kids who've been exposed to COVID    Immunization History  Administered Date(s) Administered  . Influenza,inj,Quad PF,6+ Mos 10/22/2017, 11/10/2018, 11/10/2019  . Influenza-Unspecified 08/20/2015, 08/09/2016  . Td 03/21/2008  . Tdap 04/19/2014    PDMP not reviewed this  encounter. No orders of the defined types were placed in this encounter.  No orders of the defined types were placed in this encounter.  Patient Instructions   Over-the-Counter Medications & Home Remedies for Upper Respiratory Illness  Note: the following list assumes no pregnancy, normal liver & kidney function and no other drug interactions. Dr. Lyn Hollingshead has highlighted medications which are safe for you to use, but these may not be appropriate for everyone. Always ask a pharmacist or qualified medical provider if you have any questions!   Aches/Pains, Fever, Headache Acetaminophen (Tylenol) 500 mg tablets - take max 2 tablets (1000 mg) every 6 hours (4 times per day)  Ibuprofen (Motrin) 200 mg tablets - take max 4 tablets (800 mg) every 6 hours*  Sinus Congestion Prescription Atrovent as directed Cromolyn Nasal Spray (NasalCrom) 1 spray each nostril 3-4 times per day, max 6 imes per day Nasal Saline if desired Oxymetolazone (Afrin, others) sparing use due to rebound congestion, NEVER use in kids Phenylephrine (Sudafed) 10 mg tablets every 4 hours (or the 12-hour formulation)* Diphenhydramine (Benadryl) 25 mg tablets - take max 2 tablets every 4 hours  Cough & Sore Throat Prescription cough pills or syrups as directed Dextromethorphan (Robitussin, others) - cough suppressant Guaifenesin (Robitussin, Mucinex, others) - expectorant (helps cough up mucus) (Dextromethorphan and Guaifenesin also come in a combination tablet) Lozenges w/ Benzocaine + Menthol (Cepacol) Honey - as much as you want! Teas which "coat the throat" - look for ingredients [facility],  Licorice Root, Marshmallow Root  Other Antibiotics if these are prescribed - take ALL, even if you're feeling better  Zinc Lozenges within 24 hours of symptoms onset - mixed evidence this shortens the duration of the common cold Don't waste your money on Vitamin C or Echinacea  *Caution in patients with high blood  pressure   What should I do if my child was exposed to someone with COVID-19?--If your child was in close contact with someone with COVID-19, what to do next depends on whether your child has already had COVID-19 or gotten the vaccine:  ?If your child has not had COVID-19 or gotten the vaccine - They should get tested if possible, even if they don't have any symptoms. Call their doctor or nurse if you aren't sure where to get a test. Whether or not your child is tested, they should self-quarantine at home after an exposure. This means staying home and away from other people as much as possible. The safest thing to do is to self-quarantine for 14 days. Some public health departments might allow people to stop quarantining sooner, especially if they get a negative test. If you're not sure how long your child should quarantine for, contact your local public health office or ask your child's doctor or nurse.  ?If your child has had COVID-19 or gotten the vaccine - If your child had COVID-19 within the last 3 months, they do not need to self-quarantine. If they had COVID-19 but it was more than 3 months ago, follow the steps above. If your child is fully vaccinated, they do not need to self-quarantine. But they should still get tested 3 to 5 days after they were in contact with the person who had COVID-19. Even though they are much less likely to get the infection after being vaccinated, it is still possible.  If your child self-quarantines for less than 14 days, or if they do not need to self-quarantine, you should still monitor them for symptoms for the full 14 days. If they start to have any symptoms, call their doctor or nurse right away. Your child should also be extra careful about wearing a mask and social distancing during this time. In certain cases, doctors might recommend treatment with something called "monoclonal antibodies." This might lower the risk of getting infected after exposure. It might  be an option for some children who are at risk for getting seriously ill and have not been vaccinated. Examples include children under the age of 1 year or with certain medical conditions. It might also be recommended for children who are vaccinated but whose immune system is weaker than normal. You can ask your doctor or nurse if your child should get this treatment.       Instructions sent via MyChart.   Follow Up Instructions: Return if symptoms worsen or fail to improve.    I discussed the assessment and treatment plan with the patient. The patient was provided an opportunity to ask questions and all were answered. The patient agreed with the plan and demonstrated an understanding of the instructions.   The patient was advised to call back or seek an in-person evaluation if any new concerns, if symptoms worsen or if the condition fails to improve as anticipated.  30 minutes of non-face-to-face time was provided during this encounter.      . . . . . . . . . . . . . Marland Kitchen  Historical information moved to improve visibility of documentation.  Past Medical History:  Diagnosis Date  . ADD (attention deficit disorder)   . Class 1 obesity due to excess calories without serious comorbidity with body mass index (BMI) of 33.0 to 33.9 in adult 02/23/2017  . Low vitamin D level 02/23/2017   Past Surgical History:  Procedure Laterality Date  . VASECTOMY  2016  . WISDOM TOOTH EXTRACTION  2005   Social History   Tobacco Use  . Smoking status: Never Smoker  . Smokeless tobacco: Never Used  Substance Use Topics  . Alcohol use: Yes    Alcohol/week: 4.0 standard drinks    Types: 4 Standard drinks or equivalent per week    Comment: Socially   family history includes Meniere's disease in his father.  Medications: Current Outpatient Medications  Medication Sig Dispense Refill  . amphetamine-dextroamphetamine (ADDERALL) 20 MG tablet Take 1 tablet  (20 mg total) by mouth 2 (two) times daily. 180 tablet 0  . MELATONIN GUMMIES PO Take by mouth.    . Multiple Vitamin (ONE-A-DAY MENS PO) Take by mouth.     No current facility-administered medications for this visit.   Allergies  Allergen Reactions  . Trazodone And Nefazodone Other (See Comments)    Night terrors

## 2020-11-16 ENCOUNTER — Encounter: Payer: Self-pay | Admitting: Osteopathic Medicine

## 2020-11-17 ENCOUNTER — Telehealth: Payer: BLUE CROSS/BLUE SHIELD | Admitting: Osteopathic Medicine

## 2020-11-17 ENCOUNTER — Emergency Department
Admission: EM | Admit: 2020-11-17 | Discharge: 2020-11-17 | Discharge status: Against Medical Advice | Payer: BLUE CROSS/BLUE SHIELD | Source: Home / Self Care

## 2020-11-17 ENCOUNTER — Emergency Department: Admit: 2020-11-17 | Discharge status: Absent without leave | Payer: Self-pay

## 2020-11-17 DIAGNOSIS — R509 Fever, unspecified: Secondary | ICD-10-CM | POA: Diagnosis not present

## 2020-11-17 DIAGNOSIS — R0602 Shortness of breath: Secondary | ICD-10-CM | POA: Diagnosis not present

## 2020-11-17 DIAGNOSIS — R0989 Other specified symptoms and signs involving the circulatory and respiratory systems: Secondary | ICD-10-CM | POA: Diagnosis not present

## 2020-11-17 DIAGNOSIS — U071 COVID-19: Secondary | ICD-10-CM | POA: Diagnosis not present

## 2020-11-17 DIAGNOSIS — R079 Chest pain, unspecified: Secondary | ICD-10-CM | POA: Diagnosis not present

## 2020-11-25 ENCOUNTER — Ambulatory Visit: Payer: BLUE CROSS/BLUE SHIELD | Admitting: Osteopathic Medicine

## 2020-11-28 ENCOUNTER — Ambulatory Visit: Payer: BLUE CROSS/BLUE SHIELD | Admitting: Osteopathic Medicine

## 2020-12-26 ENCOUNTER — Ambulatory Visit: Payer: BLUE CROSS/BLUE SHIELD | Admitting: Osteopathic Medicine

## 2021-01-17 ENCOUNTER — Other Ambulatory Visit: Payer: Self-pay

## 2021-01-17 ENCOUNTER — Ambulatory Visit (INDEPENDENT_AMBULATORY_CARE_PROVIDER_SITE_OTHER): Payer: 59 | Admitting: Osteopathic Medicine

## 2021-01-17 ENCOUNTER — Encounter: Payer: Self-pay | Admitting: Osteopathic Medicine

## 2021-01-17 DIAGNOSIS — F902 Attention-deficit hyperactivity disorder, combined type: Secondary | ICD-10-CM

## 2021-01-17 MED ORDER — AMPHETAMINE-DEXTROAMPHETAMINE 20 MG PO TABS: 20.0000 mg | ORAL_TABLET | Freq: Two times a day (BID) | ORAL | 0 refills | Status: DC

## 2021-01-17 NOTE — Progress Notes (Signed)
Stephen Lawrence is a 35 y.o. male who presents to  [facility] at [facility]  today, 01/17/21, seeking care for the following:  Marland Kitchen Maintain Rx: ADHD. Doing well! Working and in school. Managing his tasks well and reports stressed but not overwhelmed  . Monitor BP on meds   BP Readings from Last 3 Encounters:  01/17/21 125/85  05/26/20 125/80  11/10/19 128/77        ASSESSMENT & PLAN with other pertinent findings:  The encounter diagnosis was Attention deficit hyperactivity disorder (ADHD), combined type.   6 mos refills sent to pharmacy on file  There are no Patient Instructions on file for this visit.  No orders of the defined types were placed in this encounter.   Meds ordered this encounter  Medications  . DISCONTD: amphetamine-dextroamphetamine (ADDERALL) 20 MG tablet    Sig: Take 1 tablet (20 mg total) by mouth 2 (two) times daily.    Dispense:  180 tablet    Refill:  0  . amphetamine-dextroamphetamine (ADDERALL) 20 MG tablet    Sig: Take 1 tablet (20 mg total) by mouth 2 (two) times daily.    Dispense:  180 tablet    Refill:  0     See below for relevant physical exam findings  See below for recent lab and imaging results reviewed  Medications, allergies, PMH, PSH, SocH, FamH reviewed below    Follow-up instructions: Return in about 6 months (around 07/20/2021) for ANNUAL CHECK-UP - SEE [place] SOONER IF NEEDED.                                        Exam:  BP 125/85 (BP Location: Left Arm, Patient Position: Sitting, Cuff Size: Large)   Pulse 67   Temp 98.2 F (36.8 C) (Oral)   Wt 261 lb 1.9 oz (118.4 kg)   BMI 37.47 kg/m   Constitutional: VS see above. General Appearance: alert, well-developed, well-nourished, NAD  Neck: No masses, trachea midline.   Respiratory: Normal respiratory effort. no wheeze, no rhonchi, no rales  Cardiovascular: S1/S2 normal, no murmur, no  rub/gallop auscultated. RRR.   Musculoskeletal: Gait normal. Symmetric and independent movement of all extremities  Neurological: Normal balance/coordination. No tremor.  Skin: warm, dry, intact.   Psychiatric: Normal judgment/insight. Normal mood and affect. Oriented x3.   Current Meds  Medication Sig  . MELATONIN GUMMIES PO Take by mouth.  . Multiple Vitamin (ONE-A-DAY MENS PO) Take by mouth.  . [DISCONTINUED] amphetamine-dextroamphetamine (ADDERALL) 20 MG tablet Take 1 tablet (20 mg total) by mouth 2 (two) times daily.    Allergies  Allergen Reactions  . Trazodone And Nefazodone Other (See Comments)    Night terrors    Patient Active Problem List   Diagnosis Date Noted  . Class 1 obesity due to excess calories without serious comorbidity with body mass index (BMI) of 33.0 to 33.9 in adult 02/23/2017  . Low vitamin D level 02/23/2017  . ADD (attention deficit disorder) 03/22/2015    Family History  Problem Relation Age of Onset  . Meniere's disease Father     Social History   Tobacco Use  Smoking Status Never Smoker  Smokeless Tobacco Never Used    Past Surgical History:  Procedure Laterality Date  . VASECTOMY  2016  . WISDOM TOOTH EXTRACTION  2005    Immunization History  Administered Date(s)  Administered  . Influenza,inj,Quad PF,6+ Mos 10/22/2017, 11/10/2018, 11/10/2019  . Influenza-Unspecified 08/20/2015, 08/09/2016  . Td 03/21/2008  . Tdap 04/19/2014    No results found for this or any previous visit (from the past 2160 hour(s)).  No results found.     All questions at time of visit were answered - patient instructed to contact office with any additional concerns or updates. ER/RTC precautions were reviewed with the patient as applicable.   Please note: manual typing as well as voice recognition software may have been used to produce this document - typos may escape review. Please contact Dr. Lyn Hollingshead for any needed clarifications.

## 2021-03-08 ENCOUNTER — Encounter: Payer: Self-pay | Admitting: Osteopathic Medicine

## 2021-03-08 DIAGNOSIS — F902 Attention-deficit hyperactivity disorder, combined type: Secondary | ICD-10-CM

## 2021-03-14 ENCOUNTER — Other Ambulatory Visit: Payer: Self-pay

## 2021-03-14 DIAGNOSIS — F902 Attention-deficit hyperactivity disorder, combined type: Secondary | ICD-10-CM

## 2021-03-14 MED ORDER — AMPHETAMINE-DEXTROAMPHETAMINE 20 MG PO TABS: 20.0000 mg | ORAL_TABLET | Freq: Two times a day (BID) | ORAL | 0 refills | Status: DC

## 2021-03-14 NOTE — Telephone Encounter (Signed)
Earland's insurance will not allow a 90 prescription. Please refill the 30 day.

## 2021-03-15 ENCOUNTER — Other Ambulatory Visit: Payer: Self-pay

## 2021-03-15 DIAGNOSIS — F902 Attention-deficit hyperactivity disorder, combined type: Secondary | ICD-10-CM

## 2021-03-15 MED ORDER — AMPHETAMINE-DEXTROAMPHETAMINE 20 MG PO TABS: 20.0000 mg | ORAL_TABLET | Freq: Two times a day (BID) | ORAL | 0 refills | Status: DC

## 2021-03-15 NOTE — Telephone Encounter (Signed)
Pt has sent several msgs regarding his adderall rx. Per pt, he is unable to get his rx. Pls advise, thanks.

## 2021-03-15 NOTE — Telephone Encounter (Signed)
Patient needs a 30 day supply. Insurance will not pay for a 90 day supply.

## 2021-04-13 ENCOUNTER — Other Ambulatory Visit: Payer: Self-pay | Admitting: Osteopathic Medicine

## 2021-04-14 MED ORDER — AMPHETAMINE-DEXTROAMPHETAMINE 20 MG PO TABS: 20.0000 mg | ORAL_TABLET | Freq: Two times a day (BID) | ORAL | 0 refills | Status: DC

## 2021-05-11 ENCOUNTER — Encounter: Payer: Self-pay | Admitting: Osteopathic Medicine

## 2021-05-11 ENCOUNTER — Other Ambulatory Visit: Payer: Self-pay | Admitting: Osteopathic Medicine

## 2021-07-21 ENCOUNTER — Encounter: Payer: 59 | Admitting: Osteopathic Medicine

## 2021-08-08 ENCOUNTER — Other Ambulatory Visit: Payer: Self-pay

## 2021-08-08 DIAGNOSIS — F902 Attention-deficit hyperactivity disorder, combined type: Secondary | ICD-10-CM

## 2021-08-08 MED ORDER — AMPHETAMINE-DEXTROAMPHETAMINE 20 MG PO TABS: 20.0000 mg | ORAL_TABLET | Freq: Two times a day (BID) | ORAL | 0 refills | Status: DC

## 2021-08-08 NOTE — Telephone Encounter (Signed)
Pt requesting a refill of Adderall 20 mg. Pt is due for an OV.   Rx has been tee'd up below and ready for review and approval/denial.

## 2021-09-12 ENCOUNTER — Other Ambulatory Visit: Payer: Self-pay | Admitting: Osteopathic Medicine

## 2021-09-12 DIAGNOSIS — F902 Attention-deficit hyperactivity disorder, combined type: Secondary | ICD-10-CM

## 2021-09-12 MED ORDER — AMPHETAMINE-DEXTROAMPHETAMINE 20 MG PO TABS: 20.0000 mg | ORAL_TABLET | Freq: Two times a day (BID) | ORAL | 0 refills | Status: DC

## 2021-09-12 NOTE — Telephone Encounter (Signed)
Pt called.  He is a former patient of Dr. Lyn Hollingshead.  He has scheduled an appointment with Dr. Ashley Royalty for November 9th and wants enough med to last until is appointment.

## 2021-09-12 NOTE — Telephone Encounter (Signed)
LVM informing pt of RX.  T. Tomoki Lucken, CMA 

## 2021-09-12 NOTE — Telephone Encounter (Signed)
Thank you :)

## 2021-09-27 ENCOUNTER — Other Ambulatory Visit: Payer: Self-pay

## 2021-09-27 ENCOUNTER — Encounter: Payer: Self-pay | Admitting: Family Medicine

## 2021-09-27 ENCOUNTER — Ambulatory Visit (INDEPENDENT_AMBULATORY_CARE_PROVIDER_SITE_OTHER): Payer: Self-pay | Admitting: Family Medicine

## 2021-09-27 VITALS — BP 127/77 | HR 60 | Temp 97.8°F | Ht 70.0 in | Wt 257.0 lb

## 2021-09-27 DIAGNOSIS — Z23 Encounter for immunization: Secondary | ICD-10-CM

## 2021-09-27 DIAGNOSIS — F902 Attention-deficit hyperactivity disorder, combined type: Secondary | ICD-10-CM

## 2021-09-27 MED ORDER — AMPHETAMINE-DEXTROAMPHETAMINE 20 MG PO TABS: 20.0000 mg | ORAL_TABLET | Freq: Two times a day (BID) | ORAL | 0 refills | Status: DC

## 2021-09-27 NOTE — Progress Notes (Signed)
Stephen Lawrence - 35 y.o. male MRN [id number]  Date of birth: May 09, 1986  Subjective Chief Complaint  Patient presents with   Follow-up    HPI Stephen Lawrence is a 35 year old male here today for follow-up visit.  He is transferring care from Dr. Lyn Hollingshead.  He has a history of ADD.  He is currently managed with Adderall 20 mg twice daily.  He reports he is doing well with this.  He has not had any significant side effects at current dosing of medication.  He denies chest pain, increased anxiety or increased insomnia.  ROS:  A comprehensive ROS was completed and negative except as noted per HPI  Allergies  Allergen Reactions   Trazodone And Nefazodone Other (See Comments)    Night terrors    Past Medical History:  Diagnosis Date   ADD (attention deficit disorder)    Class 1 obesity due to excess calories without serious comorbidity with body mass index (BMI) of 33.0 to 33.9 in adult 02/23/2017   Low vitamin D level 02/23/2017    Past Surgical History:  Procedure Laterality Date   VASECTOMY  2016   WISDOM TOOTH EXTRACTION  2005    Social History   Socioeconomic History   Marital status: Single    Spouse name: Not on file   Number of children: Not on file   Years of education: Not on file   Highest education level: Not on file  Occupational History   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    Employer: [employer]  Tobacco Use   Smoking status: Never   Smokeless tobacco: Never  Vaping Use   Vaping Use: Never used  Substance and Sexual Activity   Alcohol use: Yes    Alcohol/week: 4.0 standard drinks    Types: 4 Standard drinks or equivalent per week    Comment: Socially   Drug use: Never   Sexual activity: Yes    Partners: Female  Other Topics Concern   Not on file  Social History Narrative   Not on file   Social Determinants of Health   Financial Resource Strain: Not on file  Food Insecurity: Not on file  Transportation Needs: Not on file  Physical Activity: Not on  file  Stress: Not on file  Social Connections: Not on file    Family History  Problem Relation Age of Onset   Meniere's disease Father     Health Maintenance  Topic Date Due   Hepatitis C Screening  Never done   TETANUS/TDAP  04/19/2024   INFLUENZA VACCINE  Completed   HIV Screening  Completed   Pneumococcal Vaccine 69-6 Years old  Aged Out   HPV VACCINES  Aged Out   COVID-19 Vaccine  Discontinued     ----------------------------------------------------------------------------------------------------------------------------------------------------------------------------------------------------------------- Physical Exam BP 127/77 (BP Location: Left Arm, Patient Position: Sitting, Cuff Size: Large)   Pulse 60   Temp 97.8 F (36.6 C)   Ht 5\' 10"  (1.778 m)   Wt 257 lb (116.6 kg)   SpO2 99%   BMI 36.88 kg/m   Physical Exam Constitutional:      Appearance: Normal appearance.  Eyes:     General: No scleral icterus. Cardiovascular:     Rate and Rhythm: Normal rate and regular rhythm.  Pulmonary:     Effort: Pulmonary effort is normal.     Breath sounds: Normal breath sounds.  Musculoskeletal:     Cervical back: Neck supple.  Skin:    General: Skin is warm and dry.  Neurological:     General: No focal deficit present.     Mental Status: He is alert.  Psychiatric:        Mood and Affect: Mood normal.        Behavior: Behavior normal.    ------------------------------------------------------------------------------------------------------------------------------------------------------------------------------------------------------------------- Assessment and Plan  ADD (attention deficit disorder) He continues to do well with current dose of Adderall at 20 mg twice daily.  We will continue at current strength.  PDMP reviewed.  Prescriptions renewed and we will plan to follow-up in 6 months.   Meds ordered this encounter  Medications    amphetamine-dextroamphetamine (ADDERALL) 20 MG tablet    Sig: Take 1 tablet (20 mg total) by mouth 2 (two) times daily.    Dispense:  60 tablet    Refill:  0    Fill in 60 days from 11/25 rx.   amphetamine-dextroamphetamine (ADDERALL) 20 MG tablet    Sig: Take 1 tablet (20 mg total) by mouth 2 (two) times daily.    Dispense:  60 tablet    Refill:  0    Fill in 30 days from 11/25 rx   amphetamine-dextroamphetamine (ADDERALL) 20 MG tablet    Sig: Take 1 tablet (20 mg total) by mouth 2 (two) times daily.    Dispense:  60 tablet    Refill:  0    Fill on 11/25    Return in about 6 months (around 03/27/2022) for ADD.    This visit occurred during the SARS-CoV-2 public health emergency.  Safety protocols were in place, including screening questions prior to the visit, additional usage of staff PPE, and extensive cleaning of exam room while observing appropriate contact time as indicated for disinfecting solutions.

## 2021-09-27 NOTE — Assessment & Plan Note (Signed)
He continues to do well with current dose of Adderall at 20 mg twice daily.  We will continue at current strength.  PDMP reviewed.  Prescriptions renewed and we will plan to follow-up in 6 months.

## 2021-09-27 NOTE — Patient Instructions (Addendum)
Nice to meet you today! Continue current medication.  See me again in 6 months or sooner if needed.

## 2021-12-17 ENCOUNTER — Encounter: Payer: Self-pay | Admitting: Family Medicine

## 2021-12-17 DIAGNOSIS — F902 Attention-deficit hyperactivity disorder, combined type: Secondary | ICD-10-CM

## 2021-12-18 MED ORDER — AMPHETAMINE-DEXTROAMPHETAMINE 20 MG PO TABS: 20.0000 mg | ORAL_TABLET | Freq: Two times a day (BID) | ORAL | 0 refills | Status: DC

## 2021-12-18 NOTE — Telephone Encounter (Signed)
Rx signed. Thanks!

## 2021-12-26 MED ORDER — AMPHETAMINE-DEXTROAMPHETAMINE 30 MG PO TABS: 30.0000 mg | ORAL_TABLET | Freq: Every day | ORAL | 0 refills | Status: DC

## 2021-12-27 ENCOUNTER — Encounter: Payer: Self-pay | Admitting: Family Medicine

## 2021-12-27 NOTE — Telephone Encounter (Signed)
Already taken care of.  Please check medications to see when last refill sent in prior to forwarding message.  Thanks!

## 2022-01-25 ENCOUNTER — Encounter: Payer: Self-pay | Admitting: Family Medicine

## 2022-01-25 DIAGNOSIS — F902 Attention-deficit hyperactivity disorder, combined type: Secondary | ICD-10-CM

## 2022-01-25 MED ORDER — AMPHETAMINE-DEXTROAMPHETAMINE 20 MG PO TABS: 20.0000 mg | ORAL_TABLET | Freq: Two times a day (BID) | ORAL | 0 refills | Status: DC

## 2022-02-22 ENCOUNTER — Other Ambulatory Visit: Payer: Self-pay | Admitting: Family Medicine

## 2022-02-22 DIAGNOSIS — F902 Attention-deficit hyperactivity disorder, combined type: Secondary | ICD-10-CM

## 2022-02-22 MED ORDER — AMPHETAMINE-DEXTROAMPHETAMINE 20 MG PO TABS: 20.0000 mg | ORAL_TABLET | Freq: Two times a day (BID) | ORAL | 0 refills | Status: DC

## 2022-02-22 NOTE — Telephone Encounter (Signed)
Routing to covering provider. Thanks in advance.  

## 2022-02-23 ENCOUNTER — Encounter: Payer: Self-pay | Admitting: Family Medicine

## 2022-02-26 NOTE — Telephone Encounter (Signed)
Did you want to send this or have patient call around to other pharmacies?  ?

## 2022-03-26 ENCOUNTER — Other Ambulatory Visit: Payer: Self-pay | Admitting: Medical-Surgical

## 2022-03-26 ENCOUNTER — Encounter: Payer: Self-pay | Admitting: Family Medicine

## 2022-03-26 DIAGNOSIS — F902 Attention-deficit hyperactivity disorder, combined type: Secondary | ICD-10-CM

## 2022-03-26 MED ORDER — AMPHETAMINE-DEXTROAMPHETAMINE 20 MG PO TABS: 20.0000 mg | ORAL_TABLET | Freq: Two times a day (BID) | ORAL | 0 refills | Status: DC

## 2022-03-26 NOTE — Telephone Encounter (Signed)
completed

## 2022-03-27 ENCOUNTER — Ambulatory Visit: Payer: Self-pay | Admitting: Family Medicine

## 2022-04-23 ENCOUNTER — Other Ambulatory Visit: Payer: Self-pay | Admitting: Family Medicine

## 2022-04-23 DIAGNOSIS — F902 Attention-deficit hyperactivity disorder, combined type: Secondary | ICD-10-CM

## 2022-04-23 MED ORDER — AMPHETAMINE-DEXTROAMPHETAMINE 20 MG PO TABS: 20.0000 mg | ORAL_TABLET | Freq: Two times a day (BID) | ORAL | 0 refills | Status: DC

## 2022-04-23 NOTE — Telephone Encounter (Signed)
Please review refill request.  I am not authorized to either refuse or approve refill request for this medication.  T. Kaylene Dawn, CMA 

## 2022-04-27 ENCOUNTER — Encounter: Payer: Self-pay | Admitting: Family Medicine

## 2022-04-27 ENCOUNTER — Ambulatory Visit (INDEPENDENT_AMBULATORY_CARE_PROVIDER_SITE_OTHER): Payer: BC Managed Care – PPO | Admitting: Family Medicine

## 2022-04-27 VITALS — BP 116/79 | HR 74 | Ht 70.0 in | Wt 254.0 lb

## 2022-04-27 DIAGNOSIS — F902 Attention-deficit hyperactivity disorder, combined type: Secondary | ICD-10-CM

## 2022-04-27 DIAGNOSIS — S63501A Unspecified sprain of right wrist, initial encounter: Secondary | ICD-10-CM

## 2022-04-27 NOTE — Assessment & Plan Note (Signed)
I prescribed a R sided wrist brace today.  He will continue this as needed.

## 2022-04-27 NOTE — Patient Instructions (Signed)
Great to see you today! Wear the brace for the next several days as needed for comfort.

## 2022-04-27 NOTE — Assessment & Plan Note (Signed)
He continues to do quite well with current dose of adderall.  Will plan to continue at current strength.   Follow up in 6 months.

## 2022-04-27 NOTE — Progress Notes (Signed)
[name] - 36 y.o. male MRN [id number]  Date of birth: December 10, 1985  Subjective Chief Complaint  Patient presents with   Wrist Injury    HPI Stephen Lawrence is a 36 y.o. male here today for follow up.   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  Landed on R wrist.  He has been using a L wrist brace on the R hand but does not that he has some improvement after immobilization.  He did also take ibuprofen for a few days. He denies swelling or bruising.   He reports that current dosing of adderall seems to be working well for him.  He denies side effects related to current strength and dosing.  He would like to continue this.   ROS:  A comprehensive ROS was completed and negative except as noted per HPI  Allergies  Allergen Reactions   Trazodone And Nefazodone Other (See Comments)    Night terrors    Past Medical History:  Diagnosis Date   ADD (attention deficit disorder)    Class 1 obesity due to excess calories without serious comorbidity with body mass index (BMI) of 33.0 to 33.9 in adult 02/23/2017   Low vitamin D level 02/23/2017    Past Surgical History:  Procedure Laterality Date   VASECTOMY  2016   WISDOM TOOTH EXTRACTION  2005    Social History   Socioeconomic History   Marital status: Single    Spouse name: Not on file   Number of children: Not on file   Years of education: Not on file   Highest education level: Not on file  Occupational History   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    Employer: [employer]  Tobacco Use   Smoking status: Never   Smokeless tobacco: Never  Vaping Use   Vaping Use: Never used  Substance and Sexual Activity   Alcohol use: Yes    Alcohol/week: 4.0 standard drinks of alcohol    Types: 4 Standard drinks or equivalent per week    Comment: Socially   Drug use: Never   Sexual activity: Yes    Partners: Female  Other Topics Concern   Not on file  Social History Narrative   Not on file   Social Determinants of Health    Financial Resource Strain: Not on file  Food Insecurity: Not on file  Transportation Needs: Not on file  Physical Activity: Not on file  Stress: Not on file  Social Connections: Not on file    Family History  Problem Relation Age of Onset   Meniere's disease Father     Health Maintenance  Topic Date Due   Hepatitis C Screening  04/28/2023 (Originally 03/15/2006)   INFLUENZA VACCINE  06/19/2022   TETANUS/TDAP  04/19/2024   HIV Screening  Completed   HPV VACCINES  Aged Out   COVID-19 Vaccine  Discontinued     ----------------------------------------------------------------------------------------------------------------------------------------------------------------------------------------------------------------- Physical Exam BP 116/79 (BP Location: Left Arm, Patient Position: Sitting, Cuff Size: Large)   Pulse 74   Ht 5\' 10"  (1.778 m)   Wt 254 lb (115.2 kg)   SpO2 98%   BMI 36.45 kg/m   Physical Exam Constitutional:      Appearance: Normal appearance.  Eyes:     General: No scleral icterus. Cardiovascular:     Rate and Rhythm: Normal rate.  Pulmonary:     Effort: Pulmonary effort is normal.     Breath sounds: Normal breath sounds.  Musculoskeletal:     Cervical back:  Neck supple.     Comments: R wrist without swelling or bruising.  FROM.  TTP along lateral wrist.  Mild pain with radial deviation and extension.    Neurological:     General: No focal deficit present.     Mental Status: He is alert.  Psychiatric:        Mood and Affect: Mood normal.        Behavior: Behavior normal.     ------------------------------------------------------------------------------------------------------------------------------------------------------------------------------------------------------------------- Assessment and Plan  Sprain of right wrist I prescribed a R sided wrist brace today.  He will continue this as needed.    ADD (attention deficit disorder) He  continues to do quite well with current dose of adderall.  Will plan to continue at current strength.   Follow up in 6 months.    No orders of the defined types were placed in this encounter.   No follow-ups on file.    This visit occurred during the SARS-CoV-2 public health emergency.  Safety protocols were in place, including screening questions prior to the visit, additional usage of staff PPE, and extensive cleaning of exam room while observing appropriate contact time as indicated for disinfecting solutions.

## 2022-05-18 ENCOUNTER — Other Ambulatory Visit: Payer: Self-pay | Admitting: Family Medicine

## 2022-05-18 DIAGNOSIS — F902 Attention-deficit hyperactivity disorder, combined type: Secondary | ICD-10-CM

## 2022-05-18 MED ORDER — AMPHETAMINE-DEXTROAMPHETAMINE 20 MG PO TABS: 20.0000 mg | ORAL_TABLET | Freq: Two times a day (BID) | ORAL | 0 refills | Status: DC

## 2022-06-19 ENCOUNTER — Other Ambulatory Visit: Payer: Self-pay | Admitting: Family Medicine

## 2022-06-19 DIAGNOSIS — F902 Attention-deficit hyperactivity disorder, combined type: Secondary | ICD-10-CM

## 2022-06-19 MED ORDER — AMPHETAMINE-DEXTROAMPHETAMINE 20 MG PO TABS: 20.0000 mg | ORAL_TABLET | Freq: Two times a day (BID) | ORAL | 0 refills | Status: DC

## 2022-07-17 ENCOUNTER — Other Ambulatory Visit: Payer: Self-pay | Admitting: Family Medicine

## 2022-07-17 DIAGNOSIS — F902 Attention-deficit hyperactivity disorder, combined type: Secondary | ICD-10-CM

## 2022-07-17 NOTE — Telephone Encounter (Signed)
Last OV 04/27/22.  Last RX was 06/19/22 for #60.  Tiajuana Amass, CMA

## 2022-07-18 MED ORDER — AMPHETAMINE-DEXTROAMPHETAMINE 20 MG PO TABS: 20.0000 mg | ORAL_TABLET | Freq: Two times a day (BID) | ORAL | 0 refills | Status: DC

## 2022-08-14 ENCOUNTER — Other Ambulatory Visit: Payer: Self-pay | Admitting: Family Medicine

## 2022-08-14 DIAGNOSIS — F902 Attention-deficit hyperactivity disorder, combined type: Secondary | ICD-10-CM

## 2022-08-15 ENCOUNTER — Encounter: Payer: Self-pay | Admitting: Family Medicine

## 2022-08-15 MED ORDER — AMPHETAMINE-DEXTROAMPHETAMINE 20 MG PO TABS: 20.0000 mg | ORAL_TABLET | Freq: Two times a day (BID) | ORAL | 0 refills | Status: DC

## 2022-09-10 ENCOUNTER — Other Ambulatory Visit: Payer: Self-pay | Admitting: Family Medicine

## 2022-09-10 DIAGNOSIS — F902 Attention-deficit hyperactivity disorder, combined type: Secondary | ICD-10-CM

## 2022-09-10 NOTE — Telephone Encounter (Signed)
Last OV: 04/27/22 Next OV: 11/02/22 Last RF: 08/15/22

## 2022-09-11 MED ORDER — AMPHETAMINE-DEXTROAMPHETAMINE 20 MG PO TABS: 20.0000 mg | ORAL_TABLET | Freq: Two times a day (BID) | ORAL | 0 refills | Status: DC

## 2022-10-09 ENCOUNTER — Other Ambulatory Visit: Payer: Self-pay | Admitting: Family Medicine

## 2022-10-09 DIAGNOSIS — F902 Attention-deficit hyperactivity disorder, combined type: Secondary | ICD-10-CM

## 2022-10-10 MED ORDER — AMPHETAMINE-DEXTROAMPHETAMINE 20 MG PO TABS
20.0000 mg | ORAL_TABLET | Freq: Two times a day (BID) | ORAL | 0 refills | Status: DC
Start: 2022-10-10 — End: 2022-11-09

## 2022-10-19 ENCOUNTER — Encounter: Payer: Self-pay | Admitting: Family Medicine

## 2022-10-19 ENCOUNTER — Telehealth (INDEPENDENT_AMBULATORY_CARE_PROVIDER_SITE_OTHER): Payer: BC Managed Care – PPO | Admitting: Family Medicine

## 2022-10-19 DIAGNOSIS — R059 Cough, unspecified: Secondary | ICD-10-CM

## 2022-10-19 DIAGNOSIS — J988 Other specified respiratory disorders: Secondary | ICD-10-CM

## 2022-10-19 DIAGNOSIS — R5383 Other fatigue: Secondary | ICD-10-CM | POA: Diagnosis not present

## 2022-10-19 MED ORDER — HYDROCOD POLI-CHLORPHE POLI ER 10-8 MG/5ML PO SUER: 5.0000 mL | Freq: Every evening | ORAL | 0 refills | Status: DC | PRN

## 2022-10-19 MED ORDER — BENZONATATE 200 MG PO CAPS: 200.0000 mg | ORAL_CAPSULE | Freq: Two times a day (BID) | ORAL | 1 refills | Status: DC | PRN

## 2022-10-19 NOTE — Progress Notes (Signed)
Stephen Lawrence - 36 y.o. male MRN [id number]  Date of birth: 1986-06-17   This visit type was conducted due to national recommendations for restrictions regarding the COVID-19 Pandemic (e.g. social distancing).  This format is felt to be most appropriate for this patient at this time.  All issues noted in this document were discussed and addressed.  No physical exam was performed (except for noted visual exam findings with Video Visits).  I discussed the limitations of evaluation and management by telemedicine and the availability of in person appointments. The patient expressed understanding and agreed to proceed.  I connected withNAME@ on 10/19/22 at 10:30 AM EST by a video enabled telemedicine application and verified that I am speaking with the correct person using two identifiers.  Present at visit: Everrett Coombe, DO Claiborne Rigg   Patient Location: Home [address] [place] [address]   Provider location:   [facility]  No chief complaint on file.   HPI  Stephen Lawrence is a 36 y.o. male who presents via audio/video conferencing for a telehealth visit today.  He has complaint of chest and sinus congestion, low grade fever, productive cough and mild dyspnea. Symptoms started about 5 days ago.  He does feel a little better today.  Still with fatigue and cough.  Cough does affect his sleep.  Has tried mucinex and OTC cough syrup with limited relief.  Trying to increase his hydration.    ROS:  A comprehensive ROS was completed and negative except as noted per HPI  Past Medical History:  Diagnosis Date   ADD (attention deficit disorder)    Class 1 obesity due to excess calories without serious comorbidity with body mass index (BMI) of 33.0 to 33.9 in adult 02/23/2017   Low vitamin D level 02/23/2017    Past Surgical History:  Procedure Laterality Date   VASECTOMY  2016   WISDOM TOOTH EXTRACTION  2005    Family History  Problem Relation Age of Onset    Meniere's disease Father     Social History   Socioeconomic History   Marital status: Single    Spouse name: Not on file   Number of children: Not on file   Years of education: Not on file   Highest education level: Not on file  Occupational History   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    Employer:   Tobacco Use   Smoking status: Never   Smokeless tobacco: Never  Vaping Use   Vaping Use: Never used  Substance and Sexual Activity   Alcohol use: Yes    Alcohol/week: 4.0 standard drinks of alcohol    Types: 4 Standard drinks or equivalent per week    Comment: Socially   Drug use: Never   Sexual activity: Yes    Partners: Female  Other Topics Concern   Not on file  Social History Narrative   Not on file   Social Determinants of Health   Financial Resource Strain: Not on file  Food Insecurity: Not on file  Transportation Needs: Not on file  Physical Activity: Not on file  Stress: Not on file  Social Connections: Not on file  Intimate Partner Violence: Not on file     Current Outpatient Medications:    benzonatate (TESSALON) 200 MG capsule, Take 1 capsule (200 mg total) by mouth 2 (two) times daily as needed for cough., Disp: 20 capsule, Rfl: 1   chlorpheniramine-HYDROcodone (TUSSIONEX) 10-8 MG/5ML, Take 5 mLs by  mouth at bedtime as needed for cough., Disp: 120 mL, Rfl: 0   amphetamine-dextroamphetamine (ADDERALL) 20 MG tablet, Take 1 tablet (20 mg total) by mouth 2 (two) times daily., Disp: 60 tablet, Rfl: 0   Multiple Vitamin (ONE-A-DAY MENS PO), Take by mouth., Disp: , Rfl:   EXAM:  VITALS per patient if applicable: There were no vitals taken for this visit.  GENERAL: alert, oriented, appears well and in no acute distress  HEENT: atraumatic, conjunttiva clear, no obvious abnormalities on inspection of external nose and ears  NECK: normal movements of the head and neck  LUNGS: on inspection no signs of respiratory distress, breathing rate  appears normal, no obvious gross SOB, gasping or wheezing  CV: no obvious cyanosis  MS: moves all visible extremities without noticeable abnormality  PSYCH/NEURO: pleasant and cooperative, no obvious depression or anxiety, speech and thought processing grossly intact  ASSESSMENT AND PLAN:  Discussed the following assessment and plan:  Respiratory infection COVID negative at home.  Symptoms improved some, still with cough and fatigue.  Discussed that cough may linger for several weeks. Continue supportive care at home with increased fluids, rest, humidifier and/or nasal saline for congestion and airway irritation.  Adding tessalon perles PRN and tussionex as needed at bedtime.  Red flags reviewed.  Return for in person visit if not improving/ worsening.      I discussed the assessment and treatment plan with the patient. The patient was provided an opportunity to ask questions and all were answered. The patient agreed with the plan and demonstrated an understanding of the instructions.   The patient was advised to call back or seek an in-person evaluation if the symptoms worsen or if the condition fails to improve as anticipated.    Everrett Coombe, DO

## 2022-10-19 NOTE — Assessment & Plan Note (Signed)
COVID negative at home.  Symptoms improved some, still with cough and fatigue.  Discussed that cough may linger for several weeks. Continue supportive care at home with increased fluids, rest, humidifier and/or nasal saline for congestion and airway irritation.  Adding tessalon perles PRN and tussionex as needed at bedtime.  Red flags reviewed.  Return for in person visit if not improving/ worsening.

## 2022-11-02 ENCOUNTER — Ambulatory Visit: Payer: BC Managed Care – PPO | Admitting: Family Medicine

## 2022-11-09 ENCOUNTER — Other Ambulatory Visit: Payer: Self-pay | Admitting: Family Medicine

## 2022-11-09 DIAGNOSIS — F902 Attention-deficit hyperactivity disorder, combined type: Secondary | ICD-10-CM

## 2022-11-09 MED ORDER — AMPHETAMINE-DEXTROAMPHETAMINE 20 MG PO TABS: 20.0000 mg | ORAL_TABLET | Freq: Two times a day (BID) | ORAL | 0 refills | Status: DC

## 2022-11-09 NOTE — Telephone Encounter (Signed)
Will fill x 30 days but no additional fills until seen.

## 2022-11-13 ENCOUNTER — Encounter: Payer: Self-pay | Admitting: Family Medicine

## 2022-11-13 NOTE — Telephone Encounter (Signed)
Left message advising of recommendations.  

## 2022-12-05 ENCOUNTER — Encounter: Payer: BC Managed Care – PPO | Admitting: Family Medicine

## 2022-12-10 ENCOUNTER — Other Ambulatory Visit: Payer: Self-pay | Admitting: Family Medicine

## 2022-12-10 DIAGNOSIS — F902 Attention-deficit hyperactivity disorder, combined type: Secondary | ICD-10-CM

## 2022-12-10 MED ORDER — AMPHETAMINE-DEXTROAMPHETAMINE 20 MG PO TABS: 20.0000 mg | ORAL_TABLET | Freq: Two times a day (BID) | ORAL | 0 refills | Status: DC

## 2022-12-21 ENCOUNTER — Ambulatory Visit (INDEPENDENT_AMBULATORY_CARE_PROVIDER_SITE_OTHER): Payer: BC Managed Care – PPO | Admitting: Family Medicine

## 2022-12-21 ENCOUNTER — Encounter: Payer: Self-pay | Admitting: Family Medicine

## 2022-12-21 VITALS — BP 118/79 | HR 99 | Ht 70.0 in | Wt 256.0 lb

## 2022-12-21 DIAGNOSIS — Z23 Encounter for immunization: Secondary | ICD-10-CM | POA: Diagnosis not present

## 2022-12-21 DIAGNOSIS — L219 Seborrheic dermatitis, unspecified: Secondary | ICD-10-CM | POA: Diagnosis not present

## 2022-12-21 DIAGNOSIS — Z0000 Encounter for general adult medical examination without abnormal findings: Secondary | ICD-10-CM

## 2022-12-21 DIAGNOSIS — F902 Attention-deficit hyperactivity disorder, combined type: Secondary | ICD-10-CM | POA: Diagnosis not present

## 2022-12-21 DIAGNOSIS — Z1329 Encounter for screening for other suspected endocrine disorder: Secondary | ICD-10-CM

## 2022-12-21 DIAGNOSIS — Z13220 Encounter for screening for lipoid disorders: Secondary | ICD-10-CM | POA: Diagnosis not present

## 2022-12-21 MED ORDER — KETOCONAZOLE 2 % EX CREA: 1.0000 | TOPICAL_CREAM | Freq: Every day | CUTANEOUS | 1 refills | Status: DC

## 2022-12-21 MED ORDER — AMPHETAMINE-DEXTROAMPHETAMINE 20 MG PO TABS: 20.0000 mg | ORAL_TABLET | Freq: Three times a day (TID) | ORAL | 0 refills | Status: DC

## 2022-12-21 NOTE — Patient Instructions (Signed)
Preventive Care 21-35 Years Old, Male Preventive care refers to lifestyle choices and visits with your health care provider that can promote health and wellness. Preventive care visits are also called wellness exams. What can I expect for my preventive care visit? Counseling During your preventive care visit, your health care provider may ask about your: Medical history, including: Past medical problems. Family medical history. Current health, including: Emotional well-being. Home life and relationship well-being. Sexual activity. Lifestyle, including: Alcohol, nicotine or tobacco, and drug use. Access to firearms. Diet, exercise, and sleep habits. Safety issues such as seatbelt and bike helmet use. Sunscreen use. Work and work environment. Physical exam Your health care provider may check your: Height and weight. These may be used to calculate your BMI (body mass index). BMI is a measurement that tells if you are at a healthy weight. Waist circumference. This measures the distance around your waistline. This measurement also tells if you are at a healthy weight and may help predict your risk of certain diseases, such as type 2 diabetes and high blood pressure. Heart rate and blood pressure. Body temperature. Skin for abnormal spots. What immunizations do I need?  Vaccines are usually given at various ages, according to a schedule. Your health care provider will recommend vaccines for you based on your age, medical history, and lifestyle or other factors, such as travel or where you work. What tests do I need? Screening Your health care provider may recommend screening tests for certain conditions. This may include: Lipid and cholesterol levels. Diabetes screening. This is done by checking your blood sugar (glucose) after you have not eaten for a while (fasting). Hepatitis B test. Hepatitis C test. HIV (human immunodeficiency virus) test. STI (sexually transmitted infection)  testing, if you are at risk. Talk with your health care provider about your test results, treatment options, and if necessary, the need for more tests. Follow these instructions at home: Eating and drinking  Eat a healthy diet that includes fresh fruits and vegetables, whole grains, lean protein, and low-fat dairy products. Drink enough fluid to keep your urine pale yellow. Take vitamin and mineral supplements as recommended by your health care provider. Do not drink alcohol if your health care provider tells you not to drink. If you drink alcohol: Limit how much you have to 0-2 drinks a day. Know how much alcohol is in your drink. In the [place] one drink equals one 12 oz bottle of beer (355 mL), one 5 oz glass of wine (148 mL), or one 1 oz glass of hard liquor (44 mL). Lifestyle Brush your teeth every morning and night with fluoride toothpaste. Floss one time each day. Exercise for at least 30 minutes 5 or more days each week. Do not use any products that contain nicotine or tobacco. These products include cigarettes, chewing tobacco, and vaping devices, such as e-cigarettes. If you need help quitting, ask your health care provider. Do not use drugs. If you are sexually active, practice safe sex. Use a condom or other form of protection to prevent STIs. Find healthy ways to manage stress, such as: Meditation, yoga, or listening to music. Journaling. Talking to a trusted person. Spending time with friends and family. Minimize exposure to UV radiation to reduce your risk of skin cancer. Safety Always wear your seat belt while driving or riding in a vehicle. Do not drive: If you have been drinking alcohol. Do not ride with someone who has been drinking. If you have been using any mind-altering substances   or drugs. While texting. When you are tired or distracted. Wear a helmet and other protective equipment during sports activities. If you have firearms in your house, make sure you  follow all gun safety procedures. Seek help if you have been physically or sexually abused. What's next? Go to your health care provider once a year for an annual wellness visit. Ask your health care provider how often you should have your eyes and teeth checked. Stay up to date on all vaccines. This information is not intended to replace advice given to you by your health care provider. Make sure you discuss any questions you have with your health care provider. Document Revised: 05/03/2021 Document Reviewed: 05/03/2021 Elsevier Patient Education  2023 Elsevier Inc.  

## 2022-12-23 DIAGNOSIS — L219 Seborrheic dermatitis, unspecified: Secondary | ICD-10-CM | POA: Insufficient documentation

## 2022-12-23 NOTE — Assessment & Plan Note (Signed)
Well adult Orders Placed This Encounter  Procedures   Flu Vaccine QUAD 6+ mos PF IM (Fluarix Quad PF)   COMPLETE METABOLIC PANEL WITH GFR   CBC with Differential   Lipid Panel w/reflex Direct LDL   TSH  Screenings: Per lab orders Immunizations: Flu vaccine given today Anticipatory guidance/risk factor reduction: Recommendations per AVS

## 2022-12-23 NOTE — Assessment & Plan Note (Signed)
Changing Adderall from 20 mg twice daily to 20 mg 3 times daily.  He will update me to let me know how this is working for him.

## 2022-12-23 NOTE — Assessment & Plan Note (Signed)
Topical ketoconazole as needed.

## 2022-12-23 NOTE — Progress Notes (Signed)
[name] - 37 y.o. male MRN [id number]  Date of birth: 05/29/1986  Subjective Chief Complaint  Patient presents with   Annual Exam    HPI Stephen Lawrence is a 37 year old male here today for annual exam.  Overall he is doing pretty well.  Continues on Adderall 20 mg for management of ADD.  He is taking this twice per day.  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  He would like to check to see if he could take this 3 times per day.  Additionally has a rash on his cheeks.  Rash is red and flaky in appearance.  Reports being prescribed a topical medication for this previously.  He is moderately active and feels like diet is pretty good.  He is a non-smoker.  Occasional alcohol use.  Review of Systems  Constitutional:  Negative for chills, fever, malaise/fatigue and weight loss.  HENT:  Negative for congestion, ear pain and sore throat.   Eyes:  Negative for blurred vision, double vision and pain.  Respiratory:  Negative for cough and shortness of breath.   Cardiovascular:  Negative for chest pain and palpitations.  Gastrointestinal:  Negative for abdominal pain, blood in stool, constipation, heartburn and nausea.  Genitourinary:  Negative for dysuria and urgency.  Musculoskeletal:  Negative for joint pain and myalgias.  Neurological:  Negative for dizziness and headaches.  Endo/Heme/Allergies:  Does not bruise/bleed easily.  Psychiatric/Behavioral:  Negative for depression. The patient is not nervous/anxious and does not have insomnia.     Allergies  Allergen Reactions   Trazodone And Nefazodone Other (See Comments)    Night terrors    Past Medical History:  Diagnosis Date   ADD (attention deficit disorder)    Class 1 obesity due to excess calories without serious comorbidity with body mass index (BMI) of 33.0 to 33.9 in adult 02/23/2017   Low vitamin D level 02/23/2017    Past Surgical History:  Procedure Laterality Date   VASECTOMY   2016   WISDOM TOOTH EXTRACTION  2005    Social History   Socioeconomic History   Marital status: Single    Spouse name: Not on file   Number of children: Not on file   Years of education: Not on file   Highest education level: Not on file  Occupational History   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    Employer: [employer]  Tobacco Use   Smoking status: Never   Smokeless tobacco: Never  Vaping Use   Vaping Use: Never used  Substance and Sexual Activity   Alcohol use: Yes    Alcohol/week: 4.0 standard drinks of alcohol    Types: 4 Standard drinks or equivalent per week    Comment: Socially   Drug use: Never   Sexual activity: Yes    Partners: Female  Other Topics Concern   Not on file  Social History Narrative   Not on file   Social Determinants of Health   Financial Resource Strain: Not on file  Food Insecurity: Not on file  Transportation Needs: Not on file  Physical Activity: Not on file  Stress: Not on file  Social Connections: Not on file    Family History  Problem Relation Age of Onset   Meniere's disease Father     Health Maintenance  Topic Date Due   Hepatitis C Screening  04/28/2023 (Originally 03/15/2006)   DTaP/Tdap/Td (3 - Td or Tdap) 04/19/2024   INFLUENZA VACCINE  Completed  HIV Screening  Completed   HPV VACCINES  Aged Out   COVID-19 Vaccine  Discontinued     ----------------------------------------------------------------------------------------------------------------------------------------------------------------------------------------------------------------- Physical Exam BP 118/79 (BP Location: Left Arm, Patient Position: Sitting, Cuff Size: Large)   Pulse 99   Ht 5\' 10"  (1.778 m)   Wt 256 lb (116.1 kg)   SpO2 99%   BMI 36.73 kg/m   Physical Exam Constitutional:      Appearance: Normal appearance.  Eyes:     General: No scleral icterus. Cardiovascular:     Rate and Rhythm: Normal rate and regular rhythm.  Pulmonary:      Effort: Pulmonary effort is normal.     Breath sounds: Normal breath sounds.  Musculoskeletal:     Cervical back: Neck supple.  Neurological:     Mental Status: He is alert.  Psychiatric:        Mood and Affect: Mood normal.        Behavior: Behavior normal.     ------------------------------------------------------------------------------------------------------------------------------------------------------------------------------------------------------------------- Assessment and Plan  ADD (attention deficit disorder) Changing Adderall from 20 mg twice daily to 20 mg 3 times daily.  He will update me to let me know how this is working for him.  Seborrheic dermatitis Topical ketoconazole as needed.  Well adult exam Well adult Orders Placed This Encounter  Procedures   Flu Vaccine QUAD 6+ mos PF IM (Fluarix Quad PF)   COMPLETE METABOLIC PANEL WITH GFR   CBC with Differential   Lipid Panel w/reflex Direct LDL   TSH  Screenings: Per lab orders Immunizations: Flu vaccine given today Anticipatory guidance/risk factor reduction: Recommendations per AVS   Meds ordered this encounter  Medications   amphetamine-dextroamphetamine (ADDERALL) 20 MG tablet    Sig: Take 1 tablet (20 mg total) by mouth 3 (three) times daily.    Dispense:  90 tablet    Refill:  0   ketoconazole (NIZORAL) 2 % cream    Sig: Apply 1 Application topically daily.    Dispense:  15 g    Refill:  1    No follow-ups on file.    This visit occurred during the SARS-CoV-2 public health emergency.  Safety protocols were in place, including screening questions prior to the visit, additional usage of staff PPE, and extensive cleaning of exam room while observing appropriate contact time as indicated for disinfecting solutions.

## 2023-01-04 ENCOUNTER — Encounter (INDEPENDENT_AMBULATORY_CARE_PROVIDER_SITE_OTHER): Payer: BC Managed Care – PPO | Admitting: Family Medicine

## 2023-01-04 DIAGNOSIS — R519 Headache, unspecified: Secondary | ICD-10-CM | POA: Diagnosis not present

## 2023-01-04 NOTE — Telephone Encounter (Signed)
Please see the MyChart message reply(ies) for my assessment and plan.    This patient gave consent for this Medical Advice Message and is aware that it may result in a bill to [employer], as well as the possibility of receiving a bill for a co-payment or deductible. They are an established patient, but are not seeking medical advice exclusively about a problem treated during an in person or video visit in the last seven days. I did not recommend an in person or video visit within seven days of my reply.    I spent a total of 9 minutes cumulative time within 7 days through [employer].  Luetta Nutting, DO

## 2023-02-01 ENCOUNTER — Other Ambulatory Visit: Payer: Self-pay | Admitting: Family Medicine

## 2023-02-01 DIAGNOSIS — F902 Attention-deficit hyperactivity disorder, combined type: Secondary | ICD-10-CM

## 2023-02-01 MED ORDER — AMPHETAMINE-DEXTROAMPHETAMINE 20 MG PO TABS: 20.0000 mg | ORAL_TABLET | Freq: Three times a day (TID) | ORAL | 0 refills | Status: DC

## 2023-02-28 ENCOUNTER — Other Ambulatory Visit: Payer: Self-pay | Admitting: Family Medicine

## 2023-02-28 DIAGNOSIS — F902 Attention-deficit hyperactivity disorder, combined type: Secondary | ICD-10-CM

## 2023-02-28 MED ORDER — AMPHETAMINE-DEXTROAMPHETAMINE 20 MG PO TABS: 20.0000 mg | ORAL_TABLET | Freq: Three times a day (TID) | ORAL | 0 refills | Status: DC

## 2023-03-29 ENCOUNTER — Other Ambulatory Visit: Payer: Self-pay | Admitting: Family Medicine

## 2023-03-29 DIAGNOSIS — F902 Attention-deficit hyperactivity disorder, combined type: Secondary | ICD-10-CM

## 2023-03-29 MED ORDER — AMPHETAMINE-DEXTROAMPHETAMINE 20 MG PO TABS: 20.0000 mg | ORAL_TABLET | Freq: Three times a day (TID) | ORAL | 0 refills | Status: DC

## 2023-04-29 ENCOUNTER — Other Ambulatory Visit: Payer: Self-pay | Admitting: Family Medicine

## 2023-04-29 DIAGNOSIS — F902 Attention-deficit hyperactivity disorder, combined type: Secondary | ICD-10-CM

## 2023-04-29 MED ORDER — AMPHETAMINE-DEXTROAMPHETAMINE 20 MG PO TABS
20.00 mg | ORAL_TABLET | Freq: Three times a day (TID) | ORAL | 0 refills | Status: DC
Start: 2023-04-29 — End: 2023-05-27

## 2023-05-27 ENCOUNTER — Other Ambulatory Visit: Payer: Self-pay | Admitting: Family Medicine

## 2023-05-27 DIAGNOSIS — F902 Attention-deficit hyperactivity disorder, combined type: Secondary | ICD-10-CM

## 2023-05-28 MED ORDER — AMPHETAMINE-DEXTROAMPHETAMINE 20 MG PO TABS
20.0000 mg | ORAL_TABLET | Freq: Three times a day (TID) | ORAL | 0 refills | Status: DC
Start: 2023-05-28 — End: 2023-06-20

## 2023-05-28 NOTE — Telephone Encounter (Signed)
Will need visit next month.   CM

## 2023-05-28 NOTE — Telephone Encounter (Signed)
Patient advised to schedule a follow up visit. He states he will schedule through MyChart.

## 2023-06-20 ENCOUNTER — Ambulatory Visit (INDEPENDENT_AMBULATORY_CARE_PROVIDER_SITE_OTHER): Payer: BC Managed Care – PPO | Admitting: Family Medicine

## 2023-06-20 ENCOUNTER — Encounter: Payer: Self-pay | Admitting: Family Medicine

## 2023-06-20 DIAGNOSIS — F902 Attention-deficit hyperactivity disorder, combined type: Secondary | ICD-10-CM

## 2023-06-20 MED ORDER — AMPHETAMINE-DEXTROAMPHETAMINE 30 MG PO TABS
30.0000 mg | ORAL_TABLET | Freq: Three times a day (TID) | ORAL | 0 refills | Status: DC
Start: 2023-06-20 — End: 2023-06-28

## 2023-06-20 NOTE — Progress Notes (Signed)
Stephen Lawrence - 37 y.o. male MRN [id number]  Date of birth: 1986/08/04  Subjective Chief Complaint  Patient presents with   ADHD    HPI Stephen Lawrence is a 37 y.o. male here today for follow up visit.   He has been treated with adderall 20mg  TID.  Works ok but feels like he may need to adjust the strength.  He has not noted any negative side.  Starting back to school in a couple of weeks.  He has been working on dietary changes and exercise as well which does seem to help with focus a little.   ROS:  A comprehensive ROS was completed and negative except as noted per HPI  Allergies  Allergen Reactions   Trazodone And Nefazodone Other (See Comments)    Night terrors    Past Medical History:  Diagnosis Date   ADD (attention deficit disorder)    Class 1 obesity due to excess calories without serious comorbidity with body mass index (BMI) of 33.0 to 33.9 in adult 02/23/2017   Low vitamin D level 02/23/2017    Past Surgical History:  Procedure Laterality Date   VASECTOMY  2016   WISDOM TOOTH EXTRACTION  2005    Social History   Socioeconomic History   Marital status: Single    Spouse name: Not on file   Number of children: Not on file   Years of education: Not on file   Highest education level: Not on file  Occupational History   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    Employer: [employer]  Tobacco Use   Smoking status: Never   Smokeless tobacco: Never  Vaping Use   Vaping status: Never Used  Substance and Sexual Activity   Alcohol use: Yes    Alcohol/week: 4.0 standard drinks of alcohol    Types: 4 Standard drinks or equivalent per week    Comment: Socially   Drug use: Never   Sexual activity: Yes    Partners: Female  Other Topics Concern   Not on file  Social History Narrative   Not on file   Social Determinants of Health   Financial Resource Strain: Not on file  Food Insecurity: Not on file  Transportation Needs: Not on file  Physical Activity: Not  on file  Stress: Not on file  Social Connections: Unknown (03/23/2022)   [facility]   Social Network    Social Network: Not on file    Family History  Problem Relation Age of Onset   Meniere's disease Father     Health Maintenance  Topic Date Due   INFLUENZA VACCINE  02/17/2024 (Originally 06/20/2023)   Hepatitis C Screening  06/19/2024 (Originally 03/15/2006)   DTaP/Tdap/Td (3 - Td or Tdap) 04/19/2024   HIV Screening  Completed   HPV VACCINES  Aged Out   COVID-19 Vaccine  Discontinued     ----------------------------------------------------------------------------------------------------------------------------------------------------------------------------------------------------------------- Physical Exam BP 116/76 (BP Location: Left Arm, Patient Position: Sitting, Cuff Size: Normal)   Pulse 75   Ht 5\' 10"  (1.778 m)   Wt 256 lb (116.1 kg)   SpO2 98%   BMI 36.73 kg/m   Physical Exam Constitutional:      Appearance: Normal appearance.  HENT:     Head: Normocephalic and atraumatic.  Eyes:     General: No scleral icterus. Cardiovascular:     Rate and Rhythm: Normal rate and regular rhythm.  Pulmonary:     Effort: Pulmonary effort is normal.  Breath sounds: Normal breath sounds.  Neurological:     General: No focal deficit present.     Mental Status: He is alert.     ------------------------------------------------------------------------------------------------------------------------------------------------------------------------------------------------------------------- Assessment and Plan  ADD (attention deficit disorder) Trial of increased strength of adderall 30mg  TID.  He will let me know how this is working for him after a couple of weeks. F/u in 6 months.    Meds ordered this encounter  Medications   amphetamine-dextroamphetamine (ADDERALL) 30 MG tablet    Sig: Take 1 tablet by mouth 3 (three) times daily for 15 days.     Dispense:  45 tablet    Refill:  0    Return in about 6 months (around 12/21/2023) for F/u ADD.    This visit occurred during the SARS-CoV-2 public health emergency.  Safety protocols were in place, including screening questions prior to the visit, additional usage of staff PPE, and extensive cleaning of exam room while observing appropriate contact time as indicated for disinfecting solutions.

## 2023-06-20 NOTE — Assessment & Plan Note (Signed)
Trial of increased strength of adderall 30mg  TID.  He will let me know how this is working for him after a couple of weeks. F/u in 6 months.

## 2023-06-28 ENCOUNTER — Encounter: Payer: Self-pay | Admitting: Family Medicine

## 2023-06-28 DIAGNOSIS — F902 Attention-deficit hyperactivity disorder, combined type: Secondary | ICD-10-CM

## 2023-06-28 MED ORDER — AMPHETAMINE-DEXTROAMPHETAMINE 30 MG PO TABS: 30.0000 mg | ORAL_TABLET | Freq: Three times a day (TID) | ORAL | 0 refills | Status: DC

## 2023-06-28 NOTE — Telephone Encounter (Signed)
Updated rx sent in.   CM

## 2023-07-26 ENCOUNTER — Other Ambulatory Visit: Payer: Self-pay | Admitting: Family Medicine

## 2023-07-26 DIAGNOSIS — F902 Attention-deficit hyperactivity disorder, combined type: Secondary | ICD-10-CM

## 2023-07-29 ENCOUNTER — Other Ambulatory Visit: Payer: Self-pay | Admitting: Family Medicine

## 2023-07-29 DIAGNOSIS — F902 Attention-deficit hyperactivity disorder, combined type: Secondary | ICD-10-CM

## 2023-07-30 ENCOUNTER — Encounter: Payer: Self-pay | Admitting: Family Medicine

## 2023-08-24 ENCOUNTER — Other Ambulatory Visit: Payer: Self-pay | Admitting: Family Medicine

## 2023-08-24 DIAGNOSIS — F902 Attention-deficit hyperactivity disorder, combined type: Secondary | ICD-10-CM

## 2023-08-28 ENCOUNTER — Other Ambulatory Visit: Payer: Self-pay | Admitting: Family Medicine

## 2023-08-28 DIAGNOSIS — F902 Attention-deficit hyperactivity disorder, combined type: Secondary | ICD-10-CM

## 2023-08-28 MED ORDER — AMPHETAMINE-DEXTROAMPHETAMINE 30 MG PO TABS: ORAL_TABLET | ORAL | 0 refills | Status: DC

## 2023-09-24 ENCOUNTER — Other Ambulatory Visit: Payer: Self-pay | Admitting: Family Medicine

## 2023-09-24 DIAGNOSIS — F902 Attention-deficit hyperactivity disorder, combined type: Secondary | ICD-10-CM

## 2023-09-24 MED ORDER — AMPHETAMINE-DEXTROAMPHETAMINE 30 MG PO TABS: ORAL_TABLET | ORAL | 0 refills | Status: DC

## 2023-10-22 ENCOUNTER — Other Ambulatory Visit: Payer: Self-pay | Admitting: Family Medicine

## 2023-10-22 DIAGNOSIS — F902 Attention-deficit hyperactivity disorder, combined type: Secondary | ICD-10-CM

## 2023-10-23 ENCOUNTER — Encounter: Payer: Self-pay | Admitting: Family Medicine

## 2023-10-24 MED ORDER — AMPHETAMINE-DEXTROAMPHETAMINE 30 MG PO TABS: 30.0000 mg | ORAL_TABLET | Freq: Three times a day (TID) | ORAL | 0 refills | Status: DC

## 2023-10-24 MED ORDER — AMPHETAMINE-DEXTROAMPHETAMINE 30 MG PO TABS: ORAL_TABLET | ORAL | 0 refills | Status: DC

## 2023-10-25 ENCOUNTER — Other Ambulatory Visit: Payer: Self-pay

## 2023-11-21 ENCOUNTER — Other Ambulatory Visit: Payer: Self-pay | Admitting: Family Medicine

## 2023-11-21 DIAGNOSIS — F902 Attention-deficit hyperactivity disorder, combined type: Secondary | ICD-10-CM

## 2023-11-22 ENCOUNTER — Other Ambulatory Visit: Payer: Self-pay

## 2023-11-22 NOTE — Telephone Encounter (Signed)
 Copied from CRM [id number]. Topic: Clinical - Prescription Issue >> Nov 22, 2023  8:35 AM Leotis ORN wrote: Reason for CRM: amphetamine -dextroamphetamine  (ADDERALL) 30 MG tablet- pt tried to request Rx via mychart- he stated that the medicated is now grayed out and shows discontinued.  Checked chart notes and it shows its now pending, pt is going out of town and just wanted to make sure the Rx was sent in  Pt requesting callback [phone]

## 2023-12-18 ENCOUNTER — Other Ambulatory Visit: Payer: Self-pay | Admitting: Family Medicine

## 2023-12-18 DIAGNOSIS — F902 Attention-deficit hyperactivity disorder, combined type: Secondary | ICD-10-CM

## 2023-12-18 MED ORDER — AMPHETAMINE-DEXTROAMPHETAMINE 30 MG PO TABS: ORAL_TABLET | ORAL | 0 refills | Status: DC

## 2023-12-26 ENCOUNTER — Ambulatory Visit (INDEPENDENT_AMBULATORY_CARE_PROVIDER_SITE_OTHER): Payer: BC Managed Care – PPO | Admitting: Family Medicine

## 2023-12-26 ENCOUNTER — Encounter: Payer: Self-pay | Admitting: Family Medicine

## 2023-12-26 VITALS — BP 123/80 | HR 73 | Ht 70.0 in | Wt 268.0 lb

## 2023-12-26 DIAGNOSIS — F902 Attention-deficit hyperactivity disorder, combined type: Secondary | ICD-10-CM | POA: Diagnosis not present

## 2023-12-26 DIAGNOSIS — L819 Disorder of pigmentation, unspecified: Secondary | ICD-10-CM | POA: Insufficient documentation

## 2023-12-26 MED ORDER — AMPHETAMINE-DEXTROAMPHETAMINE 30 MG PO TABS: 30.0000 mg | ORAL_TABLET | Freq: Three times a day (TID) | ORAL | 0 refills | Status: DC

## 2023-12-26 MED ORDER — AMPHETAMINE-DEXTROAMPHETAMINE 30 MG PO TABS: ORAL_TABLET | ORAL | 0 refills | Status: DC

## 2023-12-26 NOTE — Progress Notes (Signed)
 Stephen Lawrence - 38 y.o. male MRN [id number]  Date of birth: 04/15/1986  Subjective Chief Complaint  Patient presents with   skin lesion    HPI Stephen Lawrence is a 38 y.o. male here today for follow up of ADD.   He reports that he is doing pretty well.   Continues on adderall 30mg  TID.  Doing well at current strength.  No significant side effects at current strength.  Weight is stable.  He is sleeping pretty well.    He has lesion on his back that he would like to have checked out.  This has been present for a few years.  Recently changed in size and color looks different.     ROS:  A comprehensive ROS was completed and negative except as noted per HPI  Allergies  Allergen Reactions   Trazodone  And Nefazodone Other (See Comments)    Night terrors    Past Medical History:  Diagnosis Date   ADD (attention deficit disorder)    Class 1 obesity due to excess calories without serious comorbidity with body mass index (BMI) of 33.0 to 33.9 in adult 02/23/2017   Low vitamin D  level 02/23/2017    Past Surgical History:  Procedure Laterality Date   VASECTOMY  2016   WISDOM TOOTH EXTRACTION  2005    Social History   Socioeconomic History   Marital status: Single    Spouse name: Not on file   Number of children: Not on file   Years of education: Not on file   Highest education level: Master's degree (e.g., MA, MS, MEng, MEd, MSW, MBA)  Occupational History   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    Employer:   Tobacco Use   Smoking status: Never   Smokeless tobacco: Never  Vaping Use   Vaping status: Never Used  Substance and Sexual Activity   Alcohol use: Yes    Alcohol/week: 4.0 standard drinks of alcohol    Types: 4 Standard drinks or equivalent per week    Comment: Socially   Drug use: Never   Sexual activity: Yes    Partners: Female  Other Topics Concern   Not on file  Social History Narrative   Not on file   Social Drivers of Health   Financial  Resource Strain: High Risk (12/25/2023)   Overall Financial Resource Strain (CARDIA)    Difficulty of Paying Living Expenses: Hard  Food Insecurity: Patient Declined (12/25/2023)   Hunger Vital Sign    Worried About Running Out of Food in the Last Year: Patient declined    Ran Out of Food in the Last Year: Patient declined  Transportation Needs: No Transportation Needs (12/25/2023)   PRAPARE - Administrator, Civil Service (Medical): No    Lack of Transportation (Non-Medical): No  Physical Activity: Unknown (12/25/2023)   Exercise Vital Sign    Days of Exercise per Week: Patient declined    Minutes of Exercise per Session: Not on file  Stress: Stress Concern Present (12/25/2023)   [employer] of Occupational Health - Occupational Stress Questionnaire    Feeling of Stress : To some extent  Social Connections: Moderately Integrated (12/25/2023)   Social Connection and Isolation Panel [NHANES]    Frequency of Communication with Friends and Family: Three times a week    Frequency of Social Gatherings with Friends and Family: Three times a week    Attends Religious Services: Never    Active Member of Clubs or Organizations:  Yes    Attends Banker Meetings: Patient declined    Marital Status: Living with partner    Family History  Problem Relation Age of Onset   Meniere's disease Father     Health Maintenance  Topic Date Due   INFLUENZA VACCINE  02/17/2024 (Originally 06/20/2023)   Hepatitis C Screening  06/19/2024 (Originally 03/15/2006)   DTaP/Tdap/Td (3 - Td or Tdap) 04/19/2024   HIV Screening  Completed   HPV VACCINES  Aged Out   COVID-19 Vaccine  Discontinued     ----------------------------------------------------------------------------------------------------------------------------------------------------------------------------------------------------------------- Physical Exam BP 123/80 (BP Location: Left Arm, Patient Position: Sitting, Cuff Size:  Large)   Pulse 73   Ht 5' 10 (1.778 m)   Wt 268 lb (121.6 kg)   SpO2 100%   BMI 38.45 kg/m   Physical Exam Constitutional:      Appearance: Normal appearance.  HENT:     Head: Normocephalic and atraumatic.  Eyes:     General: No scleral icterus. Cardiovascular:     Rate and Rhythm: Normal rate and regular rhythm.  Pulmonary:     Effort: Pulmonary effort is normal.     Breath sounds: Normal breath sounds.  Neurological:     Mental Status: He is alert.  Psychiatric:        Mood and Affect: Mood normal.        Behavior: Behavior normal.     ------------------------------------------------------------------------------------------------------------------------------------------------------------------------------------------------------------------- Assessment and Plan  ADD (attention deficit disorder) Doing well with current strength of adderall, will plan to continue.   Pigmented skin lesion Referral placed to dermatology.    Meds ordered this encounter  Medications   amphetamine -dextroamphetamine  (ADDERALL) 30 MG tablet    Sig: Take 1 tablet by mouth 3 (three) times daily.    Dispense:  90 tablet    Refill:  0   amphetamine -dextroamphetamine  (ADDERALL) 30 MG tablet    Sig: TAKE ONE TABLET BY MOUTH 3 TIMES DAILY    Dispense:  90 tablet    Refill:  0    Return in about 6 months (around 06/24/2024) for ADHD.    This visit occurred during the SARS-CoV-2 public health emergency.  Safety protocols were in place, including screening questions prior to the visit, additional usage of staff PPE, and extensive cleaning of exam room while observing appropriate contact time as indicated for disinfecting solutions.

## 2023-12-26 NOTE — Assessment & Plan Note (Signed)
 Referral placed to dermatology

## 2023-12-26 NOTE — Assessment & Plan Note (Signed)
 Doing well with current strength of adderall, will plan to continue.

## 2023-12-27 ENCOUNTER — Ambulatory Visit: Payer: BC Managed Care – PPO | Admitting: Family Medicine

## 2024-01-08 ENCOUNTER — Ambulatory Visit: Payer: Self-pay | Admitting: Family Medicine

## 2024-01-08 NOTE — Telephone Encounter (Signed)
Copied from CRM [id number]. Topic: Clinical - Medication Question >> Jan 08, 2024  2:06 PM Priscille Loveless wrote: Reason for CRM: Pt has a positive covid test, test taken on Monday, he is not running a temp today but is really out of it and coughing severely. Is there anything that you can give the pt for this.   Chief Complaint: COVID Suspicion with Symptoms Symptoms: Coughing, Congestion, Chills, Fatigue, Body Aches Frequency: Acute, Since Sunday Pertinent Negatives: Patient denies dyspnea, or chest pain Disposition: [] ED /[] Urgent Care (no appt availability in office) / [x] Appointment(In office/virtual)/ []  [facility]/ [] Home Care/ [] Refused Recommended Disposition /[] [facility]/ []  Follow-up with PCP Additional Notes: DP is being triaged for COVID symptoms after a positive home test that have worsened since onset. Spoke with wife for triage after patient's verbal consent. Discussed symptoms, severity, and duration. Based on assessment, patient was advised to see PCP within 24 hours. Patient verbalized understanding and agreement with plan. Documentation provided.     Reason for Disposition  [1] Continuous (nonstop) coughing interferes with work or school AND [2] no improvement using cough treatment per Care Advice  Answer Assessment - Initial Assessment Questions 1. COVID-19 DIAGNOSIS: "How do you know that you have COVID?" (e.g., positive lab test or self-test, diagnosed by doctor or NP/PA, symptoms after exposure).     Positive Test, Monday  2. COVID-19 EXPOSURE: "Was there any known exposure to COVID before the symptoms began?" CDC Definition of close contact: within 6 feet (2 meters) for a total of 15 minutes or more over a 24-hour period.      Sister in [place] and Brother, frequent exposure  3. ONSET: "When did the COVID-19 symptoms start?"      Sunday, Late Afternoon  4. WORST SYMPTOM: "What is your worst symptom?" (e.g., cough, fever, shortness of breath, muscle  aches)     Congestion  5. COUGH: "Do you have a cough?" If Yes, ask: "How bad is the cough?"       Yes, Coughing Fits  6. FEVER: "Do you have a fever?" If Yes, ask: "What is your temperature, how was it measured, and when did it start?"     No  7. RESPIRATORY STATUS: "Describe your breathing?" (e.g., normal; shortness of breath, wheezing, unable to speak)      No  8. BETTER-SAME-WORSE: "Are you getting better, staying the same or getting worse compared to yesterday?"  If getting worse, ask, "In what way?"     Acute in onset  9. OTHER SYMPTOMS: "Do you have any other symptoms?"  (e.g., chills, fatigue, headache, loss of smell or taste, muscle pain, sore throat)     Fatigue, Headache, Chills, Body Aches  10. HIGH RISK DISEASE: "Do you have any chronic medical problems?" (e.g., asthma, heart or lung disease, weak immune system, obesity, etc.)       No  11. VACCINE: "Have you had the COVID-19 vaccine?" If Yes, ask: "Which one, how many shots, when did you get it?"       No  13. O2 SATURATION MONITOR:  "Do you use an oxygen saturation monitor (pulse oximeter) at home?" If Yes, ask "What is your reading (oxygen level) today?" "What is your usual oxygen saturation reading?" (e.g., 95%)       Unsure  Protocols used: Coronavirus (COVID-19) Diagnosed or Suspected-A-AH

## 2024-01-09 ENCOUNTER — Encounter: Payer: Self-pay | Admitting: Family Medicine

## 2024-01-09 ENCOUNTER — Telehealth (INDEPENDENT_AMBULATORY_CARE_PROVIDER_SITE_OTHER): Payer: BC Managed Care – PPO | Admitting: Family Medicine

## 2024-01-09 VITALS — Ht 70.0 in | Wt 268.0 lb

## 2024-01-09 DIAGNOSIS — U071 COVID-19: Secondary | ICD-10-CM | POA: Diagnosis not present

## 2024-01-09 MED ORDER — HYDROCOD POLI-CHLORPHE POLI ER 10-8 MG/5ML PO SUER: 5.0000 mL | Freq: Two times a day (BID) | ORAL | 0 refills | Status: DC | PRN

## 2024-01-09 MED ORDER — HYDROCOD POLI-CHLORPHE POLI ER 10-8 MG/5ML PO SUER
5.0000 mL | Freq: Two times a day (BID) | ORAL | 0 refills | Status: DC | PRN
Start: 2024-01-09 — End: 2024-05-07

## 2024-01-09 NOTE — Progress Notes (Signed)
Stephen Lawrence - 38 y.o. male MRN [id number]  Date of birth: 1986/08/09   This visit type was conducted due to national recommendations for restrictions regarding the COVID-19 Pandemic (e.g. social distancing).  This format is felt to be most appropriate for this patient at this time.  All issues noted in this document were discussed and addressed.  No physical exam was performed (except for noted visual exam findings with Video Visits).  I discussed the limitations of evaluation and management by telemedicine and the availability of in person appointments. The patient expressed understanding and agreed to proceed.  I connected withNAME@ on 01/09/24 at 11:30 AM EST by a video enabled telemedicine application and verified that I am speaking with the correct person using two identifiers.  Present at visit: Everrett Coombe, DO Claiborne Rigg   Patient Location: Home [address] [place] [address]   Provider location:   [facility]  Chief Complaint  Patient presents with   Cough   Nasal Congestion    HPI  Stephen Lawrence is a 38 y.o. male who presents via audio/video conferencing for a telehealth visit today.  Recent onset of congestion, cough, sinus pressure, fatigue and headache 4 days ago.  Tested positive for COVID at home.  Feels a little better today.  Still has pretty severe cough.  He does not feel shortness of breath, wheezing, fever, chills, or GI symptoms. He is using OTC mucinex fast max which helps some.  His SIL had some leftover rx cough syrup which he tried and this seemed to help him get a little more sleep at night.     ROS:  A comprehensive ROS was completed and negative except as noted per HPI  Past Medical History:  Diagnosis Date   ADD (attention deficit disorder)    Class 1 obesity due to excess calories without serious comorbidity with body mass index (BMI) of 33.0 to 33.9 in adult 02/23/2017   Low vitamin D level 02/23/2017    Past Surgical  History:  Procedure Laterality Date   VASECTOMY  2016   WISDOM TOOTH EXTRACTION  2005    Family History  Problem Relation Age of Onset   Meniere's disease Father     Social History   Socioeconomic History   Marital status: Single    Spouse name: Not on file   Number of children: Not on file   Years of education: Not on file   Highest education level: Master's degree (e.g., MA, MS, MEng, MEd, MSW, MBA)  Occupational History   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    Employer: [employer]  Tobacco Use   Smoking status: Never   Smokeless tobacco: Never  Vaping Use   Vaping status: Never Used  Substance and Sexual Activity   Alcohol use: Yes    Alcohol/week: 4.0 standard drinks of alcohol    Types: 4 Standard drinks or equivalent per week    Comment: Socially   Drug use: Never   Sexual activity: Yes    Partners: Female  Other Topics Concern   Not on file  Social History Narrative   Not on file   Social Drivers of Health   Financial Resource Strain: High Risk (12/25/2023)   Overall Financial Resource Strain (CARDIA)    Difficulty of Paying Living Expenses: Hard  Food Insecurity: Patient Declined (12/25/2023)   Hunger Vital Sign    Worried About Running Out of Food in the Last Year: Patient declined  Ran Out of Food in the Last Year: Patient declined  Transportation Needs: No Transportation Needs (12/25/2023)   PRAPARE - Administrator, Civil Service (Medical): No    Lack of Transportation (Non-Medical): No  Physical Activity: Unknown (12/25/2023)   Exercise Vital Sign    Days of Exercise per Week: Patient declined    Minutes of Exercise per Session: Not on file  Stress: Stress Concern Present (12/25/2023)   [employer] of Occupational Health - Occupational Stress Questionnaire    Feeling of Stress : To some extent  Social Connections: Moderately Integrated (12/25/2023)   Social Connection and Isolation Panel [NHANES]    Frequency of Communication with  Friends and Family: Three times a week    Frequency of Social Gatherings with Friends and Family: Three times a week    Attends Religious Services: Never    Active Member of Clubs or Organizations: Yes    Attends Banker Meetings: Patient declined    Marital Status: Living with partner  Intimate Partner Violence: Unknown (02/19/2022)   Received from [employer]   HITS    Physically Hurt: Not on file    Insult or Talk Down To: Not on file    Threaten Physical Harm: Not on file    Scream or Curse: Not on file     Current Outpatient Medications:    amphetamine-dextroamphetamine (ADDERALL) 30 MG tablet, Take 1 tablet by mouth 3 (three) times daily., Disp: 90 tablet, Rfl: 0   amphetamine-dextroamphetamine (ADDERALL) 30 MG tablet, TAKE ONE TABLET BY MOUTH 3 TIMES DAILY, Disp: 90 tablet, Rfl: 0   chlorpheniramine-HYDROcodone (TUSSIONEX) 10-8 MG/5ML, Take 5 mLs by mouth every 12 (twelve) hours as needed for cough., Disp: 125 mL, Rfl: 0   Multiple Vitamin (ONE-A-DAY MENS PO), Take by mouth., Disp: , Rfl:   EXAM:  VITALS per patient if applicable: Ht 5\' 10"  (1.778 m)   Wt 268 lb (121.6 kg)   BMI 38.45 kg/m   GENERAL: alert, oriented, appears well and in no acute distress  HEENT: atraumatic, conjunttiva clear, no obvious abnormalities on inspection of external nose and ears  NECK: normal movements of the head and neck  LUNGS: on inspection no signs of respiratory distress, breathing rate appears normal, no obvious gross SOB, gasping or wheezing  CV: no obvious cyanosis  MS: moves all visible extremities without noticeable abnormality  PSYCH/NEURO: pleasant and cooperative, no obvious depression or anxiety, speech and thought processing grossly intact  ASSESSMENT AND PLAN:  Discussed the following assessment and plan:  COVID Positive COVID test at home.  We did discussed paxlovid, however the majority of his symptoms seem to be improving.  Still with  severe cough and we'll add tussionex as needed for this.  Encourage supportive care with increased fluids and supplements to support immune system (vitamin c, zinc, vitamin d).  Red flags reviewed.  Discussed if worsening today or tomorrow we could still add Paxlovid if needed.      I discussed the assessment and treatment plan with the patient. The patient was provided an opportunity to ask questions and all were answered. The patient agreed with the plan and demonstrated an understanding of the instructions.   The patient was advised to call back or seek an in-person evaluation if the symptoms worsen or if the condition fails to improve as anticipated.    Everrett Coombe, DO

## 2024-01-09 NOTE — Assessment & Plan Note (Signed)
Positive COVID test at home.  We did discussed paxlovid, however the majority of his symptoms seem to be improving.  Still with severe cough and we'll add tussionex as needed for this.  Encourage supportive care with increased fluids and supplements to support immune system (vitamin c, zinc, vitamin d).  Red flags reviewed.  Discussed if worsening today or tomorrow we could still add Paxlovid if needed.

## 2024-01-09 NOTE — Progress Notes (Signed)
Symptoms started Sunday night.  Sx: congestion, cough, no fever  Meds: Mucinex FastMax, rx cough med.

## 2024-01-10 ENCOUNTER — Telehealth: Payer: Self-pay

## 2024-01-10 NOTE — Telephone Encounter (Signed)
 Copied from CRM [id number]. Topic: Clinical - Medication Question >> Jan 08, 2024 11:44 AM Antony Haste wrote: Reason for CRM: PT has tested positive for COVID, he completed a at-home test and he is requesting to have cough medicine called in to his pharmacy if possible?  [facility] - [address], [place], [place]

## 2024-01-15 ENCOUNTER — Other Ambulatory Visit: Payer: Self-pay | Admitting: Family Medicine

## 2024-01-15 NOTE — Telephone Encounter (Signed)
 Duplicate request

## 2024-01-16 MED ORDER — AMPHETAMINE-DEXTROAMPHETAMINE 30 MG PO TABS: 30.0000 mg | ORAL_TABLET | Freq: Three times a day (TID) | ORAL | 0 refills | Status: DC

## 2024-02-10 ENCOUNTER — Other Ambulatory Visit: Payer: Self-pay | Admitting: Family Medicine

## 2024-02-10 MED ORDER — AMPHETAMINE-DEXTROAMPHETAMINE 30 MG PO TABS: 30.0000 mg | ORAL_TABLET | Freq: Three times a day (TID) | ORAL | 0 refills | Status: DC

## 2024-02-10 NOTE — Telephone Encounter (Signed)
 Requesting rx rf of Adderal 30mg  Last written  01/16/2024 Last OV 12/26/2023 Upcoming appt 05/07/2024 ( physical)

## 2024-03-10 ENCOUNTER — Other Ambulatory Visit: Payer: Self-pay | Admitting: Family Medicine

## 2024-03-12 NOTE — Telephone Encounter (Signed)
 Requesting rx rf of adderall 30mg   Last written 02/10/2024 Last OV 12/26/2023 Upcoming appt 06/24/2024

## 2024-03-13 MED ORDER — AMPHETAMINE-DEXTROAMPHETAMINE 30 MG PO TABS: 30.0000 mg | ORAL_TABLET | Freq: Three times a day (TID) | ORAL | 0 refills | Status: DC

## 2024-04-06 ENCOUNTER — Encounter: Payer: Self-pay | Admitting: Family Medicine

## 2024-04-06 MED ORDER — AMPHETAMINE-DEXTROAMPHETAMINE 30 MG PO TABS: 30.0000 mg | ORAL_TABLET | Freq: Three times a day (TID) | ORAL | 0 refills | Status: DC

## 2024-05-07 ENCOUNTER — Encounter: Payer: Self-pay | Admitting: Family Medicine

## 2024-05-07 ENCOUNTER — Ambulatory Visit (INDEPENDENT_AMBULATORY_CARE_PROVIDER_SITE_OTHER): Payer: BC Managed Care – PPO | Admitting: Family Medicine

## 2024-05-07 ENCOUNTER — Ambulatory Visit

## 2024-05-07 VITALS — BP 123/81 | HR 65 | Ht 70.0 in | Wt 274.0 lb

## 2024-05-07 DIAGNOSIS — M545 Low back pain, unspecified: Secondary | ICD-10-CM

## 2024-05-07 DIAGNOSIS — F902 Attention-deficit hyperactivity disorder, combined type: Secondary | ICD-10-CM | POA: Diagnosis not present

## 2024-05-07 DIAGNOSIS — Z23 Encounter for immunization: Secondary | ICD-10-CM | POA: Diagnosis not present

## 2024-05-07 DIAGNOSIS — M5126 Other intervertebral disc displacement, lumbar region: Secondary | ICD-10-CM | POA: Diagnosis not present

## 2024-05-07 DIAGNOSIS — M5136 Other intervertebral disc degeneration, lumbar region with discogenic back pain only: Secondary | ICD-10-CM | POA: Diagnosis not present

## 2024-05-07 DIAGNOSIS — M5127 Other intervertebral disc displacement, lumbosacral region: Secondary | ICD-10-CM | POA: Diagnosis not present

## 2024-05-07 DIAGNOSIS — M48061 Spinal stenosis, lumbar region without neurogenic claudication: Secondary | ICD-10-CM | POA: Diagnosis not present

## 2024-05-07 MED ORDER — TIZANIDINE HCL 4 MG PO TABS: 4.0000 mg | ORAL_TABLET | Freq: Three times a day (TID) | ORAL | 1 refills | Status: AC | PRN

## 2024-05-07 MED ORDER — AMPHETAMINE-DEXTROAMPHETAMINE 30 MG PO TABS: 30.0000 mg | ORAL_TABLET | Freq: Three times a day (TID) | ORAL | 0 refills | Status: DC

## 2024-05-07 MED ORDER — NAPROXEN 500 MG PO TABS: 500.0000 mg | ORAL_TABLET | Freq: Two times a day (BID) | ORAL | 0 refills | Status: AC

## 2024-05-07 NOTE — Assessment & Plan Note (Signed)
 Doing well with current strength of adderall, will plan to continue.

## 2024-05-07 NOTE — Progress Notes (Signed)
 Stephen Lawrence - 38 y.o. male MRN [id number]  Date of birth: [birth year]  Subjective Chief Complaint  Patient presents with   Back Pain    HPI Stephen Lawrence is a 38 y.o. male here today with complaint of back pain.  He reports that he injured his back lifting weights about 3-4 weeks ago.  He has had tightness and feeling of catching in his lower back.  Denies radiation of pain, numbness or tingling into the extremities.  He has tried CBD, heat and muscle rub with only minimal improvement.  He is having trouble sleeping due to pain.   Also following up for ADHD.  Continues on adderall IR 30mg  TID.  This continues to work pretty well for him.  No side effects at current strength.   ROS:  A comprehensive ROS was completed and negative except as noted per HPI  Allergies  Allergen Reactions   Trazodone  And Nefazodone Other (See Comments)    Night terrors    Past Medical History:  Diagnosis Date   ADD (attention deficit disorder)    Class 1 obesity due to excess calories without serious comorbidity with body mass index (BMI) of 33.0 to 33.9 in adult 02/23/2017   Low vitamin D  level 02/23/2017    Past Surgical History:  Procedure Laterality Date   VASECTOMY  2016   WISDOM TOOTH EXTRACTION  2005    Social History   Socioeconomic History   Marital status: Single    Spouse name: Not on file   Number of children: Not on file   Years of education: Not on file   Highest education level: Master's degree (e.g., MA, MS, MEng, MEd, MSW, MBA)  Occupational History   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    Employer: [employer]  Tobacco Use   Smoking status: Never   Smokeless tobacco: Never  Vaping Use   Vaping status: Never Used  Substance and Sexual Activity   Alcohol use: Yes    Alcohol/week: 4.0 standard drinks of alcohol    Types: 4 Standard drinks or equivalent per week    Comment: Socially   Drug use: Never   Sexual activity: Yes    Partners: Female  Other Topics  Concern   Not on file  Social History Narrative   Not on file   Social Drivers of Health   Financial Resource Strain: Low Risk  (05/07/2024)   Overall Financial Resource Strain (CARDIA)    Difficulty of Paying Living Expenses: Not hard at all  Food Insecurity: No Food Insecurity (05/07/2024)   Hunger Vital Sign    Worried About Running Out of Food in the Last Year: Never true    Ran Out of Food in the Last Year: Never true  Transportation Needs: No Transportation Needs (05/07/2024)   PRAPARE - Administrator, Civil Service (Medical): No    Lack of Transportation (Non-Medical): No  Physical Activity: Unknown (12/25/2023)   Exercise Vital Sign    Days of Exercise per Week: Patient declined    Minutes of Exercise per Session: Not on file  Stress: No Stress Concern Present (05/07/2024)   [employer] of Occupational Health - Occupational Stress Questionnaire    Feeling of Stress: Not at all  Social Connections: Moderately Integrated (05/07/2024)   Social Connection and Isolation Panel    Frequency of Communication with Friends and Family: Three times a week    Frequency of Social Gatherings with Friends and Family: Three times a week  Attends Religious Services: Never    Active Member of Clubs or Organizations: Yes    Attends Banker Meetings: 1 to 4 times per year    Marital Status: Living with partner    Family History  Problem Relation Age of Onset   Meniere's disease Father     Health Maintenance  Topic Date Due   HPV VACCINES (1 - 3-dose SCDM series) Never done   Hepatitis C Screening  06/19/2024 (Originally 03/15/2006)   INFLUENZA VACCINE  06/19/2024   DTaP/Tdap/Td (4 - Td or Tdap) 05/07/2034   HIV Screening  Completed   Meningococcal B Vaccine  Aged Out   COVID-19 Vaccine  Discontinued      ----------------------------------------------------------------------------------------------------------------------------------------------------------------------------------------------------------------- Physical Exam BP 123/81 (BP Location: Left Arm, Patient Position: Sitting, Cuff Size: Large)   Pulse 65   Ht 5' 10 (1.778 m)   Wt 274 lb (124.3 kg)   SpO2 98%   BMI 39.31 kg/m   Physical Exam Constitutional:      Appearance: Normal appearance.   Cardiovascular:     Rate and Rhythm: Normal rate and regular rhythm.  Pulmonary:     Effort: Pulmonary effort is normal.     Breath sounds: Normal breath sounds.   Musculoskeletal:     Comments: Tightness across lumbar paraspinals bilateral, worse on the R.  Increased pain with extension and sidebending.     Neurological:     General: No focal deficit present.     Mental Status: He is alert.   Psychiatric:        Mood and Affect: Mood normal.        Behavior: Behavior normal.     ------------------------------------------------------------------------------------------------------------------------------------------------------------------------------------------------------------------- Assessment and Plan  Acute bilateral low back pain without sciatica Xrays of lumbar spine ordered.  Start naproxen 500mg  BID prn and tizanidine prn.  Referral placed to PT.  Can do epsom salt baths and/or muscle rubs to help with muscle tightness.  Red flags reviewed.   ADD (attention deficit disorder) Doing well with current strength of adderall, will plan to continue.    Meds ordered this encounter  Medications   amphetamine -dextroamphetamine  (ADDERALL) 30 MG tablet    Sig: Take 1 tablet by mouth 3 (three) times daily.    Dispense:  90 tablet    Refill:  0   tiZANidine (ZANAFLEX) 4 MG tablet    Sig: Take 1 tablet (4 mg total) by mouth every 8 (eight) hours as needed for muscle spasms.    Dispense:  30 tablet    Refill:  1    naproxen (NAPROSYN) 500 MG tablet    Sig: Take 1 tablet (500 mg total) by mouth 2 (two) times daily with a meal.    Dispense:  60 tablet    Refill:  0    Return in about 6 months (around 11/06/2024) for ADHD.

## 2024-05-07 NOTE — Assessment & Plan Note (Signed)
 Xrays of lumbar spine ordered.  Start naproxen 500mg  BID prn and tizanidine prn.  Referral placed to PT.  Can do epsom salt baths and/or muscle rubs to help with muscle tightness.  Red flags reviewed.

## 2024-05-24 ENCOUNTER — Ambulatory Visit: Payer: Self-pay | Admitting: Family Medicine

## 2024-05-25 NOTE — Therapy (Signed)
 OUTPATIENT PHYSICAL THERAPY THORACOLUMBAR EVALUATION   Patient Name: Stephen Lawrence MRN: [id number] DOB:12-Jun-1986,38 y.o., male Today's Date: 05/26/2024   END OF SESSION:  PT End of Session - 05/26/24 1532     Visit Number 1    Number of Visits 17    Date for PT Re-Evaluation 07/25/24    Authorization Type BC[employer]    Authorization Time Period requesting auth    PT Start Time 1532    PT Stop Time 1615    PT Time Calculation (min) 43 min    Activity Tolerance Patient tolerated treatment well    Behavior During Therapy WFL for tasks assessed/performed           Past Medical History:  Diagnosis Date   ADD (attention deficit disorder)    Class 1 obesity due to excess calories without serious comorbidity with body mass index (BMI) of 33.0 to 33.9 in adult 02/23/2017   Low vitamin D  level 02/23/2017   Past Surgical History:  Procedure Laterality Date   VASECTOMY  2016   WISDOM TOOTH EXTRACTION  2005   Patient Active Problem List   Diagnosis Date Noted   Acute bilateral low back pain without sciatica 05/07/2024   COVID 01/09/2024   Pigmented skin lesion 12/26/2023   Seborrheic dermatitis 12/23/2022   Respiratory infection 10/19/2022   Sprain of right wrist 04/27/2022   Class 1 obesity due to excess calories without serious comorbidity with body mass index (BMI) of 33.0 to 33.9 in adult 02/23/2017   Low vitamin D  level 02/23/2017   Well adult exam 12/06/2015   ADD (attention deficit disorder) 03/22/2015    PCP: Alvia Bring, DO   REFERRING PROVIDER: Alvia Bring, DO   REFERRING DIAG: M54.50 (ICD-10-CM) - Acute bilateral low back pain without sciatica   Rationale for Evaluation and Treatment: Rehabilitation  THERAPY DIAG:  Other low back pain  Pain in thoracic spine  Muscle weakness (generalized)  ONSET DATE: May 2025   SUBJECTIVE:                                                                                                                                                                                            SUBJECTIVE STATEMENT: Sometime around May he started a new exercise regimen (full body weight lifting) and was having some low level back pain. Then a few weeks later one morning he woke up and had 8/10 back pain. He tried taking ibuprofen, which helped, but every morning he was waking with intense pain that lasted for about 1.5 weeks until he went to see his PCP. He was prescribed medication and the muscle relaxer  seems to help him sleep, but this is not the solution he wants. He has tried exercising since onset of back pain, but anything that loads the back seems to exacerbate his pain. No numbness/tingling. No radicular symptoms. No red flag symptoms. No history of back pain. Currently walking for exercise.   PERTINENT HISTORY:  None   PAIN:  Are you having pain? Yes: NPRS scale: 1.5 currently; 7 at worst Pain location: low/mid back Pain description: cramps Aggravating factors: exercises that load the back, sitting in bad chairs Relieving factors: medication  PRECAUTIONS: None  WEIGHT BEARING RESTRICTIONS: No  FALLS:  Has patient fallen in last 6 months? No  LIVING ENVIRONMENT: Lives with: lives with their family Lives in: House/apartment Stairs: Yes: External: 2 flights steps; can reach both Has following equipment at home: None  OCCUPATION: architecture, a lot walking/standing activity   PLOF: Independent  PATIENT GOALS: maybe a method to mitigate pain without pills.    OBJECTIVE:  Note: Objective measures were completed at Evaluation unless otherwise noted.  DIAGNOSTIC FINDINGS:  Lumbar X-ray: IMPRESSION: 1. Minor L3-L4 degenerative disc disease. 2. Trace retrolisthesis of L5 on S1.  PATIENT SURVEYS:  Modified Oswestry: 12/50; 24%   SCREENING FOR RED FLAGS: Bowel or bladder incontinence: No Cauda equina syndrome: No   COGNITION: Overall cognitive status: Within functional limits for tasks  assessed     SENSATION: Not tested  MUSCLE LENGTH: Hamstrings: Right lacking 30 deg; Left lacking 30 deg Piriformis- tight bilateral pain with passive stretch on the LLE in low back     PALPATION: Pain and hypomobility mid/lower T-spine Pain and hypomobility UPAs L5.  Tautness/TTP bilateral thoracic paraspinals   LUMBAR ROM:   AROM eval  Flexion Full uncomfortable  Extension Full felt better than going forward.   Right lateral flexion Full   Left lateral flexion Full knot Rt side of low back  Right rotation Full knot mid back  Left rotation Full    (Blank rows = not tested)  LOWER EXTREMITY ROM:     Active  Right eval Left eval  Hip flexion    Hip extension    Hip abduction    Hip adduction    Hip internal rotation    Hip external rotation    Knee flexion    Knee extension    Ankle dorsiflexion    Ankle plantarflexion    Ankle inversion    Ankle eversion     (Blank rows = not tested)  LOWER EXTREMITY MMT:    MMT Right eval Left eval  Hip flexion 4+ 4+   Hip extension 4- 4-  Hip abduction 4 4  Hip adduction    Hip internal rotation    Hip external rotation    Knee flexion    Knee extension    Ankle dorsiflexion    Ankle plantarflexion    Ankle inversion    Ankle eversion     (Blank rows = not tested)  LUMBAR SPECIAL TESTS:  (-) SLR  FUNCTIONAL TESTS:  Plank: excessive lordosis   GAIT: Distance walked: 10 ft  Assistive device utilized: None Level of assistance: Complete Independence Comments: no obvious gait abnormalities   [facility] Adult PT Treatment:                                                DATE: 05/26/24  Therapeutic  Exercise: Demonstrated, performed, and issued initial HEP.       PATIENT EDUCATION:  Education details: see treatment Person educated: Patient Education method: Explanation, Demonstration, Tactile cues, Verbal cues, and Handouts Education comprehension: verbalized understanding, returned demonstration,  verbal cues required, tactile cues required, and needs further education  HOME EXERCISE PROGRAM: Access Code: [email or web address] URL: [email or web address] Date: 05/26/2024 Prepared by: Lucie Meeter  Exercises - Seated Hamstring Stretch  - 1 x daily - 7 x weekly - 3 sets - 30 sec  hold - Supine Lower Trunk Rotation  - 1 x daily - 7 x weekly - 2 sets - 10 reps - Supine Figure 4 Piriformis Stretch  - 1 x daily - 7 x weekly - 3 sets - 30 sec  hold - Sidelying Thoracic Rotation with Open Book  - 1 x daily - 7 x weekly - 1 sets - 10 reps - Cat Cow  - 1 x daily - 7 x weekly - 2 sets - 10 reps  ASSESSMENT:  CLINICAL IMPRESSION: Patient is a 38 y.o. male who was seen today for physical therapy evaluation and treatment for low/mid back pain that began in May when he changed his workout regimen to include total body strength training. Upon assessment he is noted to have normal trunk AROM, but does report feelings of tightness/discomfort with majority of movements. He has mild hip weakness, bilateral hip musculature tightness, core weakness, and spinal hypomobility. He responded well to spinal mobility exercises today that were issued as part of HEP. He will benefit from skilled PT to address the above stated deficits in order to optimize his function and assist in overall pain reduction.    OBJECTIVE IMPAIRMENTS: decreased activity tolerance, decreased endurance, decreased knowledge of condition, decreased strength, hypomobility, increased fascial restrictions, impaired flexibility, and pain.   ACTIVITY LIMITATIONS: carrying, lifting, bending, sitting, standing, squatting, sleeping, and locomotion level  PARTICIPATION LIMITATIONS: meal prep, cleaning, laundry, community activity, occupation, and yard work  PERSONAL FACTORS: Fitness, Profession, Time since onset of injury/illness/exacerbation, and 1 comorbidity: see PMH above are also affecting patient's functional outcome.   REHAB POTENTIAL:  Good  CLINICAL DECISION MAKING: Stable/uncomplicated  EVALUATION COMPLEXITY: Low   GOALS: Goals reviewed with patient? Yes  SHORT TERM GOALS: Target date: 06/23/2024    Patient will be independent and compliant with initial HEP.   Baseline: initial HEP issued  Goal status: INITIAL  2.  Patient will demonstrate pain free lumbar AROM to improve tolerance to reaching and bending activity.  Baseline: see above  Goal status: INITIAL  3.  Patient will improve hamstring flexibility by at least 10 degrees to reduce stress on his back.  Baseline: see above  Goal status: INITIAL   LONG TERM GOALS: Target date: 07/25/24  Patient will score </= 10 % disability on the Modified Oswestry Disability Index to signify clinically meaningful improvement in functional abilities.   Baseline: see above Goal status: INITIAL  2.  Patient will maintain plank with proper form for at least 15 seconds indicative of improved core stabilization.  Baseline: see above Goal status: INITIAL  3.  Patient will demonstrate 5/5 bilateral hip strength to improve lumbopelvic stability.  Baseline: see above Goal status: INITIAL  4.  Patient will report pain at worst rated as </= 2/10 without medication to improve sleep quality.  Baseline: see above Goal status: INITIAL  5.  Patient will be independent with advanced home program to progress/maintain current level of function.  Baseline: initial HEP issued  Goal status:  INITIAL   PLAN:  PT FREQUENCY: 1-2x/week  PT DURATION: 8 weeks  PLANNED INTERVENTIONS: 02835- PT Re-evaluation, 97750- Physical Performance Testing, 97110-Therapeutic exercises, 97530- Therapeutic activity, [id number]- Neuromuscular re-education, 97535- Self Care, 02859- Manual therapy, [id number]- Aquatic Therapy, H9716- Electrical stimulation (unattended), [id number]- Electrical stimulation (manual), [id number]- Traction (mechanical), 20560 (1-2 muscles), 20561 (3+ muscles)- Dry Needling, Cryotherapy, and  Moist heat.  PLAN FOR NEXT SESSION: review and progress HEP prn; spinal mobility exercises. Manual to improve spinal mobility. Core stabilization. Consider TPDN  Selden Noteboom, PT, DPT, ATC 05/26/24 4:52 PM

## 2024-05-26 ENCOUNTER — Ambulatory Visit: Attending: Family Medicine

## 2024-05-26 ENCOUNTER — Other Ambulatory Visit: Payer: Self-pay

## 2024-05-26 DIAGNOSIS — M546 Pain in thoracic spine: Secondary | ICD-10-CM | POA: Insufficient documentation

## 2024-05-26 DIAGNOSIS — M6281 Muscle weakness (generalized): Secondary | ICD-10-CM | POA: Insufficient documentation

## 2024-05-26 DIAGNOSIS — M5459 Other low back pain: Secondary | ICD-10-CM | POA: Diagnosis not present

## 2024-05-26 DIAGNOSIS — M545 Low back pain, unspecified: Secondary | ICD-10-CM | POA: Insufficient documentation

## 2024-06-03 ENCOUNTER — Other Ambulatory Visit: Payer: Self-pay | Admitting: Family Medicine

## 2024-06-04 ENCOUNTER — Encounter: Payer: Self-pay | Admitting: Family Medicine

## 2024-06-04 NOTE — Telephone Encounter (Signed)
 Requesting rx rf of Adderall 30mg   Last written 05/07/2024 Last OV 05/07/2024 Upcoming appt 06/24/2024

## 2024-06-05 ENCOUNTER — Ambulatory Visit: Admitting: Physical Therapy

## 2024-06-05 DIAGNOSIS — M546 Pain in thoracic spine: Secondary | ICD-10-CM | POA: Diagnosis not present

## 2024-06-05 DIAGNOSIS — M6281 Muscle weakness (generalized): Secondary | ICD-10-CM | POA: Diagnosis not present

## 2024-06-05 DIAGNOSIS — M5459 Other low back pain: Secondary | ICD-10-CM

## 2024-06-05 DIAGNOSIS — M545 Low back pain, unspecified: Secondary | ICD-10-CM | POA: Diagnosis not present

## 2024-06-05 MED ORDER — AMPHETAMINE-DEXTROAMPHETAMINE 30 MG PO TABS: 30.0000 mg | ORAL_TABLET | Freq: Three times a day (TID) | ORAL | 0 refills | Status: DC

## 2024-06-05 NOTE — Therapy (Signed)
 OUTPATIENT PHYSICAL THERAPY THORACOLUMBAR TREATMENT   Patient Name: SAVAS ELVIN MRN: [id number] DOB:27-Aug-1986,38 y.o., male Today's Date: 06/05/2024   END OF SESSION:  PT End of Session - 06/05/24 0800     Visit Number 2    Number of Visits 17    Date for PT Re-Evaluation 07/25/24    Authorization Type [employer]    Authorization Time Period 05/26/24-07/24/24    Authorization - Visit Number 2    Authorization - Number of Visits 5    PT Start Time 0802    PT Stop Time 0845    PT Time Calculation (min) 43 min    Activity Tolerance Patient tolerated treatment well    Behavior During Therapy WFL for tasks assessed/performed           Past Medical History:  Diagnosis Date   ADD (attention deficit disorder)    Class 1 obesity due to excess calories without serious comorbidity with body mass index (BMI) of 33.0 to 33.9 in adult 02/23/2017   Low vitamin D  level 02/23/2017   Past Surgical History:  Procedure Laterality Date   VASECTOMY  2016   WISDOM TOOTH EXTRACTION  2005   Patient Active Problem List   Diagnosis Date Noted   Acute bilateral low back pain without sciatica 05/07/2024   COVID 01/09/2024   Pigmented skin lesion 12/26/2023   Seborrheic dermatitis 12/23/2022   Respiratory infection 10/19/2022   Sprain of right wrist 04/27/2022   Class 1 obesity due to excess calories without serious comorbidity with body mass index (BMI) of 33.0 to 33.9 in adult 02/23/2017   Low vitamin D  level 02/23/2017   Well adult exam 12/06/2015   ADD (attention deficit disorder) 03/22/2015    PCP: Alvia Bring, DO   REFERRING PROVIDER: Alvia Bring, DO   REFERRING DIAG: M54.50 (ICD-10-CM) - Acute bilateral low back pain without sciatica   Rationale for Evaluation and Treatment: Rehabilitation  THERAPY DIAG:  Other low back pain  Pain in thoracic spine  Muscle weakness (generalized)  ONSET DATE: May 2025   SUBJECTIVE:                                                                                                                                                                                            SUBJECTIVE STATEMENT: Patient reports he is feeling better. Still gets pain with sitting and standing more than 45 minutes. Waking in the morning with pain.   PERTINENT HISTORY:  None   PAIN:  Are you having pain? Yes: NPRS scale: 1 currently; 5 at worst Pain location: low/mid back Pain description: like a knot Aggravating factors:  exercises that load the back, sitting in bad chairs Relieving factors: medication  PRECAUTIONS: None  WEIGHT BEARING RESTRICTIONS: No  FALLS:  Has patient fallen in last 6 months? No  LIVING ENVIRONMENT: Lives with: lives with their family Lives in: House/apartment Stairs: Yes: External: 2 flights steps; can reach both Has following equipment at home: None  OCCUPATION: architecture, a lot walking/standing activity   PLOF: Independent  PATIENT GOALS: maybe a method to mitigate pain without pills.    OBJECTIVE:  Note: Objective measures were completed at Evaluation unless otherwise noted.  DIAGNOSTIC FINDINGS:  Lumbar X-ray: IMPRESSION: 1. Minor L3-L4 degenerative disc disease. 2. Trace retrolisthesis of L5 on S1.  PATIENT SURVEYS:  Modified Oswestry: 12/50; 24%   SCREENING FOR RED FLAGS: Bowel or bladder incontinence: No Cauda equina syndrome: No   COGNITION: Overall cognitive status: Within functional limits for tasks assessed     SENSATION: Not tested  MUSCLE LENGTH: Hamstrings: Right lacking 30 deg; Left lacking 30 deg Piriformis- tight bilateral pain with passive stretch on the LLE in low back     PALPATION: Pain and hypomobility mid/lower T-spine Pain and hypomobility UPAs L5.  Tautness/TTP bilateral thoracic paraspinals   LUMBAR ROM:   AROM eval  Flexion Full uncomfortable  Extension Full felt better than going forward.   Right lateral flexion Full   Left lateral flexion Full  knot Rt side of low back  Right rotation Full knot mid back  Left rotation Full    (Blank rows = not tested)  LOWER EXTREMITY ROM:     Active  Right eval Left eval  Hip flexion    Hip extension    Hip abduction    Hip adduction    Hip internal rotation    Hip external rotation    Knee flexion    Knee extension    Ankle dorsiflexion    Ankle plantarflexion    Ankle inversion    Ankle eversion     (Blank rows = not tested)  LOWER EXTREMITY MMT:    MMT Right eval Left eval  Hip flexion 4+ 4+   Hip extension 4- 4-  Hip abduction 4 4  Hip adduction    Hip internal rotation    Hip external rotation    Knee flexion    Knee extension    Ankle dorsiflexion    Ankle plantarflexion    Ankle inversion    Ankle eversion     (Blank rows = not tested)  LUMBAR SPECIAL TESTS:  (-) SLR  FUNCTIONAL TESTS:  Plank: excessive lordosis   GAIT: Distance walked: 10 ft  Assistive device utilized: None Level of assistance: Complete Independence Comments: no obvious gait abnormalities  [facility] Adult PT Treatment:                                                DATE: 06/05/24 Therapeutic Exercise: Cat/cow x 10  Thread the needle x 5 each HEP update  Manual Therapy: Silicone cupping to thoracolumbar paraspinals, QL Neuromuscular re-ed: Supine posterior pelvic tilt x 10; 5 sec hold  Supine TA march 2 x 10  SLR with posterior pelvic tilt x 10 each   [facility] Adult PT Treatment:  DATE: 05/26/24  Therapeutic Exercise: Demonstrated, performed, and issued initial HEP.       PATIENT EDUCATION:  Education details: HEP update  Person educated: Patient Education method: Explanation, Demonstration, Tactile cues, Verbal cues, and Handouts Education comprehension: verbalized understanding, returned demonstration, verbal cues required, tactile cues required, and needs further education  HOME EXERCISE PROGRAM: Access Code: [email or web address] URL:  [email or web address] Date: 06/05/2024 Prepared by: Lucie Meeter  Exercises - Seated Hamstring Stretch  - 1 x daily - 7 x weekly - 3 sets - 30 sec  hold - Supine Lower Trunk Rotation  - 1 x daily - 7 x weekly - 2 sets - 10 reps - Supine Figure 4 Piriformis Stretch  - 1 x daily - 7 x weekly - 3 sets - 30 sec  hold - Sidelying Thoracic Rotation with Open Book  - 1 x daily - 7 x weekly - 1 sets - 10 reps - Cat Cow  - 1 x daily - 7 x weekly - 2 sets - 10 reps - Quadruped Full Range Thoracic Rotation with Reach  - 1 x daily - 7 x weekly - 1 sets - 10 reps - Supine Posterior Pelvic Tilt  - 1 x daily - 7 x weekly - 2 sets - 10 reps - 5 sec  hold - Supine March with Posterior Pelvic Tilt  - 1 x daily - 7 x weekly - 2 sets - 10 reps - Supine Pelvic Tilt with Straight Leg Raise  - 1 x daily - 7 x weekly - 2 sets - 10 reps  ASSESSMENT:  CLINICAL IMPRESSION: Patient reports an overall improvement in back pain since evaluation. We continued with spinal mobility work and manual techniques aimed at reducing myofascial restriction with good tolerance. Introduced core stabilization with patient requiring minimal cues initially for proper posterior pelvic tilt. No increase in back pain reported with stabilization progression.   EVAL: Patient is a 38 y.o. male who was seen today for physical therapy evaluation and treatment for low/mid back pain that began in May when he changed his workout regimen to include total body strength training. Upon assessment he is noted to have normal trunk AROM, but does report feelings of tightness/discomfort with majority of movements. He has mild hip weakness, bilateral hip musculature tightness, core weakness, and spinal hypomobility. He responded well to spinal mobility exercises today that were issued as part of HEP. He will benefit from skilled PT to address the above stated deficits in order to optimize his function and assist in overall pain reduction.     OBJECTIVE IMPAIRMENTS: decreased activity tolerance, decreased endurance, decreased knowledge of condition, decreased strength, hypomobility, increased fascial restrictions, impaired flexibility, and pain.   ACTIVITY LIMITATIONS: carrying, lifting, bending, sitting, standing, squatting, sleeping, and locomotion level  PARTICIPATION LIMITATIONS: meal prep, cleaning, laundry, community activity, occupation, and yard work  PERSONAL FACTORS: Fitness, Profession, Time since onset of injury/illness/exacerbation, and 1 comorbidity: see PMH above are also affecting patient's functional outcome.   REHAB POTENTIAL: Good  CLINICAL DECISION MAKING: Stable/uncomplicated  EVALUATION COMPLEXITY: Low   GOALS: Goals reviewed with patient? Yes  SHORT TERM GOALS: Target date: 06/23/2024    Patient will be independent and compliant with initial HEP.   Baseline: initial HEP issued  Goal status: INITIAL  2.  Patient will demonstrate pain free lumbar AROM to improve tolerance to reaching and bending activity.  Baseline: see above  Goal status: INITIAL  3.  Patient will improve hamstring flexibility by at least 10 degrees  to reduce stress on his back.  Baseline: see above  Goal status: INITIAL   LONG TERM GOALS: Target date: 07/25/24  Patient will score </= 10 % disability on the Modified Oswestry Disability Index to signify clinically meaningful improvement in functional abilities.   Baseline: see above Goal status: INITIAL  2.  Patient will maintain plank with proper form for at least 15 seconds indicative of improved core stabilization.  Baseline: see above Goal status: INITIAL  3.  Patient will demonstrate 5/5 bilateral hip strength to improve lumbopelvic stability.  Baseline: see above Goal status: INITIAL  4.  Patient will report pain at worst rated as </= 2/10 without medication to improve sleep quality.  Baseline: see above Goal status: INITIAL  5.  Patient will be independent  with advanced home program to progress/maintain current level of function.  Baseline: initial HEP issued  Goal status: INITIAL   PLAN:  PT FREQUENCY: 1-2x/week  PT DURATION: 8 weeks  PLANNED INTERVENTIONS: 97164- PT Re-evaluation, 97750- Physical Performance Testing, 97110-Therapeutic exercises, 97530- Therapeutic activity, [id number]- Neuromuscular re-education, 97535- Self Care, 02859- Manual therapy, [id number]- Aquatic Therapy, H9716- Electrical stimulation (unattended), [id number]- Electrical stimulation (manual), [id number]- Traction (mechanical), 20560 (1-2 muscles), 20561 (3+ muscles)- Dry Needling, Cryotherapy, and Moist heat.  PLAN FOR NEXT SESSION: review and progress HEP prn; spinal mobility exercises. Manual to improve spinal mobility. Core stabilization. Consider TPDN  Joeann Steppe, PT, DPT, ATC 06/05/24 8:47 AM

## 2024-06-11 ENCOUNTER — Ambulatory Visit

## 2024-06-19 ENCOUNTER — Ambulatory Visit: Attending: Family Medicine

## 2024-06-19 DIAGNOSIS — M5459 Other low back pain: Secondary | ICD-10-CM | POA: Diagnosis not present

## 2024-06-19 DIAGNOSIS — M546 Pain in thoracic spine: Secondary | ICD-10-CM | POA: Diagnosis not present

## 2024-06-19 DIAGNOSIS — M6281 Muscle weakness (generalized): Secondary | ICD-10-CM | POA: Insufficient documentation

## 2024-06-19 NOTE — Patient Instructions (Signed)

## 2024-06-19 NOTE — Therapy (Addendum)
 OUTPATIENT PHYSICAL THERAPY THORACOLUMBAR TREATMENT PHYSICAL THERAPY DISCHARGE SUMMARY  Visits from Start of Care: 3  Current functional level related to goals / functional outcomes: See goals below   Remaining deficits: Status unknown   Education / Equipment: N/A   Patient agrees to discharge. Patient goals were partially met. Patient is being discharged due to not returning since the last visit.   Patient Name: Stephen Lawrence MRN: [id number] DOB:Apr 10, 1986,38 y.o., male Today's Date: 06/19/2024   END OF SESSION:  PT End of Session - 06/19/24 0804     Visit Number 3    Number of Visits 17    Date for PT Re-Evaluation 07/25/24    Authorization Type [employer]    Authorization Time Period 05/26/24-07/24/24    Authorization - Visit Number 3    Authorization - Number of Visits 5    PT Start Time 0804    PT Stop Time 0844    PT Time Calculation (min) 40 min    Activity Tolerance Patient tolerated treatment well    Behavior During Therapy [facility] for tasks assessed/performed            Past Medical History:  Diagnosis Date   ADD (attention deficit disorder)    Class 1 obesity due to excess calories without serious comorbidity with body mass index (BMI) of 33.0 to 33.9 in adult 02/23/2017   Low vitamin D  level 02/23/2017   Past Surgical History:  Procedure Laterality Date   VASECTOMY  2016   WISDOM TOOTH EXTRACTION  2005   Patient Active Problem List   Diagnosis Date Noted   Acute bilateral low back pain without sciatica 05/07/2024   COVID 01/09/2024   Pigmented skin lesion 12/26/2023   Seborrheic dermatitis 12/23/2022   Respiratory infection 10/19/2022   Sprain of right wrist 04/27/2022   Class 1 obesity due to excess calories without serious comorbidity with body mass index (BMI) of 33.0 to 33.9 in adult 02/23/2017   Low vitamin D  level 02/23/2017   Well adult exam 12/06/2015   ADD (attention deficit disorder) 03/22/2015    PCP: Alvia Bring, DO   REFERRING  PROVIDER: Alvia Bring, DO   REFERRING DIAG: M54.50 (ICD-10-CM) - Acute bilateral low back pain without sciatica   Rationale for Evaluation and Treatment: Rehabilitation  THERAPY DIAG:  Other low back pain  Pain in thoracic spine  Muscle weakness (generalized)  ONSET DATE: May 2025   SUBJECTIVE:  SUBJECTIVE STATEMENT: Patient reports overall the back is feeling better. Attributes some of his pain to stress.   PERTINENT HISTORY:  None   PAIN:  Are you having pain? Yes: NPRS scale: none currently; 2 at worst Pain location: low/mid back Pain description: tight Aggravating factors: exercises that load the back, sitting in bad chairs Relieving factors: medication  PRECAUTIONS: None  WEIGHT BEARING RESTRICTIONS: No  FALLS:  Has patient fallen in last 6 months? No  LIVING ENVIRONMENT: Lives with: lives with their family Lives in: House/apartment Stairs: Yes: External: 2 flights steps; can reach both Has following equipment at home: None  OCCUPATION: architecture, a lot walking/standing activity   PLOF: Independent  PATIENT GOALS: maybe a method to mitigate pain without pills.    OBJECTIVE:  Note: Objective measures were completed at Evaluation unless otherwise noted.  DIAGNOSTIC FINDINGS:  Lumbar X-ray: IMPRESSION: 1. Minor L3-L4 degenerative disc disease. 2. Trace retrolisthesis of L5 on S1.  PATIENT SURVEYS:  Modified Oswestry: 12/50; 24%   SCREENING FOR RED FLAGS: Bowel or bladder incontinence: No Cauda equina syndrome: No   COGNITION: Overall cognitive status: Within functional limits for tasks assessed     SENSATION: Not tested  MUSCLE LENGTH: Hamstrings: Right lacking 30 deg; Left lacking 30 deg Piriformis- tight bilateral pain with passive stretch on the  LLE in low back   06/19/24: HS: Lt: lacking 18; Rt: lacking 15     PALPATION: Pain and hypomobility mid/lower T-spine Pain and hypomobility UPAs L5.  Tautness/TTP bilateral thoracic paraspinals   LUMBAR ROM:   AROM eval 06/19/24  Flexion Full uncomfortable Full   Extension Full felt better than going forward.  Full   Right lateral flexion Full  Full   Left lateral flexion Full knot Rt side of low back Full   Right rotation Full knot mid back Full   Left rotation Full  Full    (Blank rows = not tested)  LOWER EXTREMITY ROM:     Active  Right eval Left eval  Hip flexion    Hip extension    Hip abduction    Hip adduction    Hip internal rotation    Hip external rotation    Knee flexion    Knee extension    Ankle dorsiflexion    Ankle plantarflexion    Ankle inversion    Ankle eversion     (Blank rows = not tested)  LOWER EXTREMITY MMT:    MMT Right eval Left eval  Hip flexion 4+ 4+   Hip extension 4- 4-  Hip abduction 4 4  Hip adduction    Hip internal rotation    Hip external rotation    Knee flexion    Knee extension    Ankle dorsiflexion    Ankle plantarflexion    Ankle inversion    Ankle eversion     (Blank rows = not tested)  LUMBAR SPECIAL TESTS:  (-) SLR  FUNCTIONAL TESTS:  Plank: excessive lordosis   GAIT: Distance walked: 10 ft  Assistive device utilized: None Level of assistance: Complete Independence Comments: no obvious gait abnormalities  [facility] Adult PT Treatment:                                                DATE: 06/19/24 Therapeutic Exercise: Cat/cow x 10  Thread the needle x 5 each  Thoracic  extension on foam roller x 10 QL doorway stretch x 20 sec each Seated figure 4 x 20 sec each Manual Therapy: Demo and returned demo of use of tennis ball for self-soft tissue mobilization Neuromuscular re-ed: Seated pelvic tilts x 10  Supine TA march x 10  90/90 toe tap 2 x 10  Pallof press green band 2 x 10  Figure 4 bridge 2 x  10     [facility] Adult PT Treatment:                                                DATE: 06/05/24 Therapeutic Exercise: Cat/cow x 10  Thread the needle x 5 each HEP update  Manual Therapy: Silicone cupping to thoracolumbar paraspinals, QL Neuromuscular re-ed: Supine posterior pelvic tilt x 10; 5 sec hold  Supine TA march 2 x 10  SLR with posterior pelvic tilt x 10 each   [facility] Adult PT Treatment:                                                DATE: 05/26/24  Therapeutic Exercise: Demonstrated, performed, and issued initial HEP.       PATIENT EDUCATION:  Education details: HEP update  Person educated: Patient Education method: Explanation, Demonstration, Tactile cues, Verbal cues, and Handouts Education comprehension: verbalized understanding, returned demonstration, verbal cues required, tactile cues required, and needs further education  HOME EXERCISE PROGRAM: Access Code: [email or web address] URL: [email or web address] Date: 06/19/2024 Prepared by: Lucie Meeter  Exercises - Seated Hamstring Stretch  - 1 x daily - 7 x weekly - 3 sets - 30 sec  hold - Supine Lower Trunk Rotation  - 1 x daily - 7 x weekly - 2 sets - 10 reps - Supine Figure 4 Piriformis Stretch  - 1 x daily - 7 x weekly - 3 sets - 30 sec  hold - Sidelying Thoracic Rotation with Open Book  - 1 x daily - 7 x weekly - 1 sets - 10 reps - Cat Cow  - 1 x daily - 7 x weekly - 2 sets - 10 reps - Quadruped Full Range Thoracic Rotation with Reach  - 1 x daily - 7 x weekly - 1 sets - 10 reps - Supine Pelvic Tilt with Straight Leg Raise  - 1 x daily - 7 x weekly - 2 sets - 10 reps - Supine 90/90 Alternating Heel Touches with Posterior Pelvic Tilt  - 1 x daily - 7 x weekly - 2 sets - 10 reps - Figure 4 Bridge  - 1 x daily - 7 x weekly - 2 sets - 10 reps - Seated Figure 4 Piriformis Stretch  - 1 x daily - 7 x weekly - 3 sets - 30 sec  hold - Standing Quadratus Lumborum Stretch with Doorway  - 1 x daily - 7 x weekly - 3  sets - 30 sec  hold - Seated Pelvic Tilt  - 1 x daily - 7 x weekly - 2 sets - 10 reps - Standing Anti-Rotation Press with Anchored Resistance  - 1 x daily - 7 x weekly - 2 sets - 10 reps - Thoracic Extension Mobilization on Foam Roll  - 1 x daily - 7 x  weekly - 1 sets - 10 reps  ASSESSMENT:  CLINICAL IMPRESSION: Continued with trunk mobility and core stabilization with good tolerance. He continues to complain of mild tightness about the mid/lower back. Discussed use of tennis ball for self-soft tissue mobilization and added additional stretching/mobility work to [employer]. If tightness remains he could potentially benefit from TPDN at future sessions with handout provided today. He demonstrates full and pain free lumbar AROM today, having met this STG. Hamstring flexibility has improved having met this STG as well. No reports of pain throughout session.   EVAL: Patient is a 38 y.o. male who was seen today for physical therapy evaluation and treatment for low/mid back pain that began in May when he changed his workout regimen to include total body strength training. Upon assessment he is noted to have normal trunk AROM, but does report feelings of tightness/discomfort with majority of movements. He has mild hip weakness, bilateral hip musculature tightness, core weakness, and spinal hypomobility. He responded well to spinal mobility exercises today that were issued as part of HEP. He will benefit from skilled PT to address the above stated deficits in order to optimize his function and assist in overall pain reduction.    OBJECTIVE IMPAIRMENTS: decreased activity tolerance, decreased endurance, decreased knowledge of condition, decreased strength, hypomobility, increased fascial restrictions, impaired flexibility, and pain.   ACTIVITY LIMITATIONS: carrying, lifting, bending, sitting, standing, squatting, sleeping, and locomotion level  PARTICIPATION LIMITATIONS: meal prep, cleaning, laundry, community  activity, occupation, and yard work  PERSONAL FACTORS: Fitness, Profession, Time since onset of injury/illness/exacerbation, and 1 comorbidity: see PMH above are also affecting patient's functional outcome.   REHAB POTENTIAL: Good  CLINICAL DECISION MAKING: Stable/uncomplicated  EVALUATION COMPLEXITY: Low   GOALS: Goals reviewed with patient? Yes  SHORT TERM GOALS: Target date: 06/23/2024    Patient will be independent and compliant with initial HEP.   Baseline: initial HEP issued  Goal status: MET  2.  Patient will demonstrate pain free lumbar AROM to improve tolerance to reaching and bending activity.  Baseline: see above  Goal status: MET  3.  Patient will improve hamstring flexibility by at least 10 degrees to reduce stress on his back.  Baseline: see above  Goal status: MET   LONG TERM GOALS: Target date: 07/25/24  Patient will score </= 10 % disability on the Modified Oswestry Disability Index to signify clinically meaningful improvement in functional abilities.   Baseline: see above Goal status: INITIAL  2.  Patient will maintain plank with proper form for at least 15 seconds indicative of improved core stabilization.  Baseline: see above Goal status: INITIAL  3.  Patient will demonstrate 5/5 bilateral hip strength to improve lumbopelvic stability.  Baseline: see above Goal status: INITIAL  4.  Patient will report pain at worst rated as </= 2/10 without medication to improve sleep quality.  Baseline: see above Goal status: INITIAL  5.  Patient will be independent with advanced home program to progress/maintain current level of function.  Baseline: initial HEP issued  Goal status: INITIAL   PLAN:  PT FREQUENCY: 1-2x/week  PT DURATION: 8 weeks  PLANNED INTERVENTIONS: 97164- PT Re-evaluation, 97750- Physical Performance Testing, 97110-Therapeutic exercises, 97530- Therapeutic activity, [id number]- Neuromuscular re-education, 97535- Self Care, 02859- Manual  therapy, [id number]- Aquatic Therapy, H9716- Electrical stimulation (unattended), [id number]- Electrical stimulation (manual), [id number]- Traction (mechanical), 20560 (1-2 muscles), 20561 (3+ muscles)- Dry Needling, Cryotherapy, and Moist heat.    Hussam Muniz, PT, DPT, ATC 06/19/24 8:47 AM   Dailyn Reith,  PT, DPT, ATC 10/26/24 9:10 AM

## 2024-06-24 ENCOUNTER — Ambulatory Visit: Payer: BC Managed Care – PPO | Admitting: Family Medicine

## 2024-06-26 ENCOUNTER — Ambulatory Visit

## 2024-07-05 ENCOUNTER — Encounter: Payer: Self-pay | Admitting: Family Medicine

## 2024-07-06 MED ORDER — AMPHETAMINE-DEXTROAMPHETAMINE 30 MG PO TABS: 30.0000 mg | ORAL_TABLET | Freq: Three times a day (TID) | ORAL | 0 refills | Status: DC

## 2024-07-10 ENCOUNTER — Ambulatory Visit

## 2024-07-17 ENCOUNTER — Ambulatory Visit

## 2024-07-31 ENCOUNTER — Ambulatory Visit

## 2024-07-31 NOTE — Therapy (Incomplete)
 OUTPATIENT PHYSICAL THERAPY THORACOLUMBAR TREATMENT   Patient Name: Stephen Lawrence MRN: [id number] DOB:06-Feb-1986,38 y.o., male 42 Date: 07/31/2024   END OF SESSION:      Past Medical History:  Diagnosis Date   ADD (attention deficit disorder)    Class 1 obesity due to excess calories without serious comorbidity with body mass index (BMI) of 33.0 to 33.9 in adult 02/23/2017   Low vitamin D  level 02/23/2017   Past Surgical History:  Procedure Laterality Date   VASECTOMY  2016   WISDOM TOOTH EXTRACTION  2005   Patient Active Problem List   Diagnosis Date Noted   Acute bilateral low back pain without sciatica 05/07/2024   COVID 01/09/2024   Pigmented skin lesion 12/26/2023   Seborrheic dermatitis 12/23/2022   Respiratory infection 10/19/2022   Sprain of right wrist 04/27/2022   Class 1 obesity due to excess calories without serious comorbidity with body mass index (BMI) of 33.0 to 33.9 in adult 02/23/2017   Low vitamin D  level 02/23/2017   Well adult exam 12/06/2015   ADD (attention deficit disorder) 03/22/2015    PCP: Alvia Bring, DO   REFERRING PROVIDER: Alvia Bring, DO   REFERRING DIAG: M54.50 (ICD-10-CM) - Acute bilateral low back pain without sciatica   Rationale for Evaluation and Treatment: Rehabilitation  THERAPY DIAG:  No diagnosis found.  ONSET DATE: May 2025   SUBJECTIVE:                                                                                                                                                                                           SUBJECTIVE STATEMENT: Patient reports overall the back is feeling better. Attributes some of his pain to stress.   PERTINENT HISTORY:  None   PAIN:  Are you having pain? Yes: NPRS scale: none currently; 2 at worst Pain location: low/mid back Pain description: tight Aggravating factors: exercises that load the back, sitting in bad chairs Relieving factors: medication  PRECAUTIONS:  None  WEIGHT BEARING RESTRICTIONS: No  FALLS:  Has patient fallen in last 6 months? No  LIVING ENVIRONMENT: Lives with: lives with their family Lives in: House/apartment Stairs: Yes: External: 2 flights steps; can reach both Has following equipment at home: None  OCCUPATION: architecture, a lot walking/standing activity   PLOF: Independent  PATIENT GOALS: maybe a method to mitigate pain without pills.    OBJECTIVE:  Note: Objective measures were completed at Evaluation unless otherwise noted.  DIAGNOSTIC FINDINGS:  Lumbar X-ray: IMPRESSION: 1. Minor L3-L4 degenerative disc disease. 2. Trace retrolisthesis of L5 on S1.  PATIENT SURVEYS:  Modified Oswestry: 12/50; 24%   SCREENING FOR  RED FLAGS: Bowel or bladder incontinence: No Cauda equina syndrome: No   COGNITION: Overall cognitive status: Within functional limits for tasks assessed     SENSATION: Not tested  MUSCLE LENGTH: Hamstrings: Right lacking 30 deg; Left lacking 30 deg Piriformis- tight bilateral pain with passive stretch on the LLE in low back   06/19/24: HS: Lt: lacking 18; Rt: lacking 15     PALPATION: Pain and hypomobility mid/lower T-spine Pain and hypomobility UPAs L5.  Tautness/TTP bilateral thoracic paraspinals   LUMBAR ROM:   AROM eval 06/19/24  Flexion Full uncomfortable Full   Extension Full felt better than going forward.  Full   Right lateral flexion Full  Full   Left lateral flexion Full knot Rt side of low back Full   Right rotation Full knot mid back Full   Left rotation Full  Full    (Blank rows = not tested)  LOWER EXTREMITY ROM:     Active  Right eval Left eval  Hip flexion    Hip extension    Hip abduction    Hip adduction    Hip internal rotation    Hip external rotation    Knee flexion    Knee extension    Ankle dorsiflexion    Ankle plantarflexion    Ankle inversion    Ankle eversion     (Blank rows = not tested)  LOWER EXTREMITY MMT:    MMT  Right eval Left eval  Hip flexion 4+ 4+   Hip extension 4- 4-  Hip abduction 4 4  Hip adduction    Hip internal rotation    Hip external rotation    Knee flexion    Knee extension    Ankle dorsiflexion    Ankle plantarflexion    Ankle inversion    Ankle eversion     (Blank rows = not tested)  LUMBAR SPECIAL TESTS:  (-) SLR  FUNCTIONAL TESTS:  Plank: excessive lordosis   GAIT: Distance walked: 10 ft  Assistive device utilized: None Level of assistance: Complete Independence Comments: no obvious gait abnormalities  [facility] Adult PT Treatment:                                                DATE: 06/19/24 Therapeutic Exercise: Cat/cow x 10  Thread the needle x 5 each  Thoracic extension on foam roller x 10 QL doorway stretch x 20 sec each Seated figure 4 x 20 sec each Manual Therapy: Demo and returned demo of use of tennis ball for self-soft tissue mobilization Neuromuscular re-ed: Seated pelvic tilts x 10  Supine TA march x 10  90/90 toe tap 2 x 10  Pallof press green band 2 x 10  Figure 4 bridge 2 x 10     [facility] Adult PT Treatment:                                                DATE: 06/05/24 Therapeutic Exercise: Cat/cow x 10  Thread the needle x 5 each HEP update  Manual Therapy: Silicone cupping to thoracolumbar paraspinals, QL Neuromuscular re-ed: Supine posterior pelvic tilt x 10; 5 sec hold  Supine TA march 2 x 10  SLR with posterior pelvic tilt x  10 each   [facility] Adult PT Treatment:                                                DATE: 05/26/24  Therapeutic Exercise: Demonstrated, performed, and issued initial HEP.       PATIENT EDUCATION:  Education details: HEP update  Person educated: Patient Education method: Explanation, Demonstration, Tactile cues, Verbal cues, and Handouts Education comprehension: verbalized understanding, returned demonstration, verbal cues required, tactile cues required, and needs further education  HOME EXERCISE  PROGRAM: Access Code: [email or web address] URL: [email or web address] Date: 06/19/2024 Prepared by: Lucie Meeter  Exercises - Seated Hamstring Stretch  - 1 x daily - 7 x weekly - 3 sets - 30 sec  hold - Supine Lower Trunk Rotation  - 1 x daily - 7 x weekly - 2 sets - 10 reps - Supine Figure 4 Piriformis Stretch  - 1 x daily - 7 x weekly - 3 sets - 30 sec  hold - Sidelying Thoracic Rotation with Open Book  - 1 x daily - 7 x weekly - 1 sets - 10 reps - Cat Cow  - 1 x daily - 7 x weekly - 2 sets - 10 reps - Quadruped Full Range Thoracic Rotation with Reach  - 1 x daily - 7 x weekly - 1 sets - 10 reps - Supine Pelvic Tilt with Straight Leg Raise  - 1 x daily - 7 x weekly - 2 sets - 10 reps - Supine 90/90 Alternating Heel Touches with Posterior Pelvic Tilt  - 1 x daily - 7 x weekly - 2 sets - 10 reps - Figure 4 Bridge  - 1 x daily - 7 x weekly - 2 sets - 10 reps - Seated Figure 4 Piriformis Stretch  - 1 x daily - 7 x weekly - 3 sets - 30 sec  hold - Standing Quadratus Lumborum Stretch with Doorway  - 1 x daily - 7 x weekly - 3 sets - 30 sec  hold - Seated Pelvic Tilt  - 1 x daily - 7 x weekly - 2 sets - 10 reps - Standing Anti-Rotation Press with Anchored Resistance  - 1 x daily - 7 x weekly - 2 sets - 10 reps - Thoracic Extension Mobilization on Foam Roll  - 1 x daily - 7 x weekly - 1 sets - 10 reps  ASSESSMENT:  CLINICAL IMPRESSION: Continued with trunk mobility and core stabilization with good tolerance. He continues to complain of mild tightness about the mid/lower back. Discussed use of tennis ball for self-soft tissue mobilization and added additional stretching/mobility work to [employer]. If tightness remains he could potentially benefit from TPDN at future sessions with handout provided today. He demonstrates full and pain free lumbar AROM today, having met this STG. Hamstring flexibility has improved having met this STG as well. No reports of pain throughout session.   EVAL: Patient is a  38 y.o. male who was seen today for physical therapy evaluation and treatment for low/mid back pain that began in May when he changed his workout regimen to include total body strength training. Upon assessment he is noted to have normal trunk AROM, but does report feelings of tightness/discomfort with majority of movements. He has mild hip weakness, bilateral hip musculature tightness, core weakness, and spinal hypomobility. He responded well to spinal mobility exercises today  that were issued as part of HEP. He will benefit from skilled PT to address the above stated deficits in order to optimize his function and assist in overall pain reduction.    OBJECTIVE IMPAIRMENTS: decreased activity tolerance, decreased endurance, decreased knowledge of condition, decreased strength, hypomobility, increased fascial restrictions, impaired flexibility, and pain.   ACTIVITY LIMITATIONS: carrying, lifting, bending, sitting, standing, squatting, sleeping, and locomotion level  PARTICIPATION LIMITATIONS: meal prep, cleaning, laundry, community activity, occupation, and yard work  PERSONAL FACTORS: Fitness, Profession, Time since onset of injury/illness/exacerbation, and 1 comorbidity: see PMH above are also affecting patient's functional outcome.   REHAB POTENTIAL: Good  CLINICAL DECISION MAKING: Stable/uncomplicated  EVALUATION COMPLEXITY: Low   GOALS: Goals reviewed with patient? Yes  SHORT TERM GOALS: Target date: 06/23/2024    Patient will be independent and compliant with initial HEP.   Baseline: initial HEP issued  Goal status: MET  2.  Patient will demonstrate pain free lumbar AROM to improve tolerance to reaching and bending activity.  Baseline: see above  Goal status: MET  3.  Patient will improve hamstring flexibility by at least 10 degrees to reduce stress on his back.  Baseline: see above  Goal status: MET   LONG TERM GOALS: Target date: 07/25/24  Patient will score </= 10 %  disability on the Modified Oswestry Disability Index to signify clinically meaningful improvement in functional abilities.   Baseline: see above Goal status: INITIAL  2.  Patient will maintain plank with proper form for at least 15 seconds indicative of improved core stabilization.  Baseline: see above Goal status: INITIAL  3.  Patient will demonstrate 5/5 bilateral hip strength to improve lumbopelvic stability.  Baseline: see above Goal status: INITIAL  4.  Patient will report pain at worst rated as </= 2/10 without medication to improve sleep quality.  Baseline: see above Goal status: INITIAL  5.  Patient will be independent with advanced home program to progress/maintain current level of function.  Baseline: initial HEP issued  Goal status: INITIAL   PLAN:  PT FREQUENCY: 1-2x/week  PT DURATION: 8 weeks  PLANNED INTERVENTIONS: 97164- PT Re-evaluation, 97750- Physical Performance Testing, 97110-Therapeutic exercises, 97530- Therapeutic activity, [id number]- Neuromuscular re-education, 97535- Self Care, 02859- Manual therapy, [id number]- Aquatic Therapy, H9716- Electrical stimulation (unattended), [id number]- Electrical stimulation (manual), [id number]- Traction (mechanical), 20560 (1-2 muscles), 20561 (3+ muscles)- Dry Needling, Cryotherapy, and Moist heat.  PLAN FOR NEXT SESSION: review and progress HEP prn; spinal mobility exercises. Manual to improve spinal mobility. Core stabilization. Consider TPDN to paraspinals, QL, L5 multifidi.   Melony Tenpas, PT, DPT, ATC 07/31/24 7:54 AM

## 2024-08-03 ENCOUNTER — Encounter: Payer: Self-pay | Admitting: Family Medicine

## 2024-08-03 ENCOUNTER — Telehealth: Payer: Self-pay | Admitting: Family Medicine

## 2024-08-03 ENCOUNTER — Other Ambulatory Visit: Payer: Self-pay | Admitting: Family Medicine

## 2024-08-03 MED ORDER — AMPHETAMINE-DEXTROAMPHETAMINE 30 MG PO TABS: 30.0000 mg | ORAL_TABLET | Freq: Three times a day (TID) | ORAL | 0 refills | Status: DC

## 2024-08-03 NOTE — Telephone Encounter (Signed)
 Possible duplicate---Patient requested Adderall prescription refill Last Office Visit 05/07/2024

## 2024-08-03 NOTE — Telephone Encounter (Unsigned)
 Copied from CRM [id number]. Topic: Clinical - Medication Refill >> Aug 03, 2024  3:18 PM Laurier C wrote: Medication: amphetamine -dextroamphetamine  (ADDERALL) 30 MG tablet  Has the patient contacted their pharmacy? Yes   This is the patient's preferred pharmacy:  [facility] [place], [place] - [address]  [address]  [place] [address] Phone: [phone] Fax: [phone]  Is this the correct pharmacy for this prescription? Yes If no, delete pharmacy and type the correct one.   Has the prescription been filled recently? Yes  Is the patient out of the medication? Yes  Has the patient been seen for an appointment in the last year OR does the patient have an upcoming appointment? Yes  Can we respond through MyChart? Yes  Agent: Please be advised that Rx refills may take up to 3 business days. We ask that you follow-up with your pharmacy.

## 2024-08-20 ENCOUNTER — Ambulatory Visit

## 2024-08-28 MED ORDER — AMPHETAMINE-DEXTROAMPHETAMINE 30 MG PO TABS: 30.0000 mg | ORAL_TABLET | Freq: Three times a day (TID) | ORAL | 0 refills | Status: DC

## 2024-08-28 NOTE — Addendum Note (Signed)
 Addended by: BONNY JON DEL on: 08/28/2024 01:28 PM   Modules accepted: Orders

## 2024-08-28 NOTE — Addendum Note (Signed)
 Addended by: ALVIA VELMA BRAVO on: 08/28/2024 02:04 PM   Modules accepted: Orders

## 2024-08-31 ENCOUNTER — Other Ambulatory Visit (HOSPITAL_COMMUNITY): Payer: Self-pay

## 2024-08-31 ENCOUNTER — Telehealth: Payer: Self-pay

## 2024-08-31 NOTE — Telephone Encounter (Signed)
 Pharmacy Patient Advocate Encounter   Received notification from Onbase that prior authorization for Amphetamine -Dextroamphetamine  30MG  tablets  is required/requested.   Insurance verification completed.   The patient is insured through [facility].   Per test claim: PA required; PA submitted to above mentioned insurance via Latent Key/confirmation #/[id number] Status is pending

## 2024-09-01 ENCOUNTER — Other Ambulatory Visit (HOSPITAL_COMMUNITY): Payer: Self-pay

## 2024-09-01 NOTE — Telephone Encounter (Signed)
 Pharmacy Patient Advocate Encounter  Received notification from [facility] that Prior Authorization for Amphetamine -Dextroamphetamine  30MG  tablets has been APPROVED from 08/31/2024 to 08/31/2025. Ran test claim, Copay is $25.00. This test claim was processed through [facility]- copay amounts may vary at other pharmacies due to pharmacy/plan contracts, or as the patient moves through the different stages of their insurance plan.   PA #/Case ID/Reference #: [id number]

## 2024-09-27 ENCOUNTER — Encounter: Payer: Self-pay | Admitting: Family Medicine

## 2024-09-28 MED ORDER — AMPHETAMINE-DEXTROAMPHETAMINE 30 MG PO TABS: 30.0000 mg | ORAL_TABLET | Freq: Three times a day (TID) | ORAL | 0 refills | Status: DC

## 2024-10-26 ENCOUNTER — Other Ambulatory Visit: Payer: Self-pay | Admitting: Family Medicine

## 2024-10-26 MED ORDER — AMPHETAMINE-DEXTROAMPHETAMINE 30 MG PO TABS: 30.0000 mg | ORAL_TABLET | Freq: Three times a day (TID) | ORAL | 0 refills | Status: DC

## 2024-10-26 NOTE — Addendum Note (Signed)
 Addended by: Alainna Stawicki E on: 10/26/2024 12:57 PM   Modules accepted: Orders

## 2024-10-26 NOTE — Addendum Note (Signed)
 Addended by: BONNY JON DEL on: 10/26/2024 09:11 AM   Modules accepted: Orders

## 2024-11-06 ENCOUNTER — Encounter: Payer: Self-pay | Admitting: Family Medicine

## 2024-11-06 ENCOUNTER — Other Ambulatory Visit: Payer: Self-pay | Admitting: Family Medicine

## 2024-11-06 ENCOUNTER — Ambulatory Visit: Admitting: Family Medicine

## 2024-11-06 VITALS — BP 129/87 | HR 87 | Temp 97.5°F | Resp 18 | Ht 72.0 in | Wt 271.0 lb

## 2024-11-06 DIAGNOSIS — Z23 Encounter for immunization: Secondary | ICD-10-CM | POA: Diagnosis not present

## 2024-11-06 DIAGNOSIS — F902 Attention-deficit hyperactivity disorder, combined type: Secondary | ICD-10-CM

## 2024-11-06 DIAGNOSIS — Z0000 Encounter for general adult medical examination without abnormal findings: Secondary | ICD-10-CM

## 2024-11-06 DIAGNOSIS — Z13220 Encounter for screening for lipoid disorders: Secondary | ICD-10-CM

## 2024-11-06 DIAGNOSIS — R7989 Other specified abnormal findings of blood chemistry: Secondary | ICD-10-CM

## 2024-11-06 NOTE — Progress Notes (Signed)
 " Stephen Lawrence - 38 y.o. male MRN [id number]  Date of birth: [birth year]  Subjective Chief Complaint  Patient presents with   Acute Visit    HPI Stephen Lawrence is a 38 y.o. male here today for annual exam.   Reports that he is doing well.  Doing well with current strength of adderall.  No side effects at current strength.    Moderately active.  He feels that his diet is doing ok.    He is a non-smoker.   Occasional EtOH use.   Review of Systems  Constitutional:  Negative for chills, fever, malaise/fatigue and weight loss.  HENT:  Negative for congestion, ear pain and sore throat.   Eyes:  Negative for blurred vision, double vision and pain.  Respiratory:  Negative for cough and shortness of breath.   Cardiovascular:  Negative for chest pain and palpitations.  Gastrointestinal:  Negative for abdominal pain, blood in stool, constipation, heartburn and nausea.  Genitourinary:  Negative for dysuria and urgency.  Musculoskeletal:  Negative for joint pain and myalgias.  Neurological:  Negative for dizziness and headaches.  Endo/Heme/Allergies:  Does not bruise/bleed easily.  Psychiatric/Behavioral:  Negative for depression. The patient is not nervous/anxious and does not have insomnia.     Allergies[1]  Past Medical History:  Diagnosis Date   ADD (attention deficit disorder)    Class 1 obesity due to excess calories without serious comorbidity with body mass index (BMI) of 33.0 to 33.9 in adult 02/23/2017   Low vitamin D  level 02/23/2017    Past Surgical History:  Procedure Laterality Date   VASECTOMY  2016   WISDOM TOOTH EXTRACTION  2005    Social History   Socioeconomic History   Marital status: Single    Spouse name: Not on file   Number of children: Not on file   Years of education: Not on file   Highest education level: Master's degree (e.g., MA, MS, MEng, MEd, MSW, MBA)  Occupational History   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    Employer: [employer]   Tobacco Use   Smoking status: Never   Smokeless tobacco: Never  Vaping Use   Vaping status: Never Used  Substance and Sexual Activity   Alcohol use: Yes    Alcohol/week: 4.0 standard drinks of alcohol    Types: 4 Standard drinks or equivalent per week    Comment: Socially   Drug use: Never   Sexual activity: Yes    Partners: Female  Other Topics Concern   Not on file  Social History Narrative   Not on file   Social Drivers of Health   Tobacco Use: Low Risk (11/06/2024)   Patient History    Smoking Tobacco Use: Never    Smokeless Tobacco Use: Never    Passive Exposure: Not on file  Financial Resource Strain: Low Risk (05/07/2024)   Overall Financial Resource Strain (CARDIA)    Difficulty of Paying Living Expenses: Not hard at all  Food Insecurity: No Food Insecurity (05/07/2024)   Epic    Worried About Programme Researcher, Broadcasting/film/video in the Last Year: Never true    Ran Out of Food in the Last Year: Never true  Transportation Needs: No Transportation Needs (05/07/2024)   Epic    Lack of Transportation (Medical): No    Lack of Transportation (Non-Medical): No  Physical Activity: Unknown (12/25/2023)   Exercise Vital Sign    Days of Exercise per Week: Patient declined    Minutes of  Exercise per Session: Not on file  Stress: No Stress Concern Present (05/07/2024)   [employer] of Occupational Health - Occupational Stress Questionnaire    Feeling of Stress: Not at all  Social Connections: Moderately Integrated (05/07/2024)   Social Connection and Isolation Panel    Frequency of Communication with Friends and Family: Three times a week    Frequency of Social Gatherings with Friends and Family: Three times a week    Attends Religious Services: Never    Active Member of Clubs or Organizations: Yes    Attends Banker Meetings: 1 to 4 times per year    Marital Status: Living with partner  Depression (PHQ2-9): Low Risk (05/07/2024)   Depression (PHQ2-9)    PHQ-2 Score: 0   Alcohol Screen: Low Risk (12/25/2023)   Alcohol Screen    Last Alcohol Screening Score (AUDIT): 1  Housing: Low Risk (05/07/2024)   Epic    Unable to Pay for Housing in the Last Year: No    Number of Times Moved in the Last Year: 0    Homeless in the Last Year: No  Utilities: Not At Risk (05/07/2024)   Epic    Threatened with loss of utilities: No  Health Literacy: Adequate Health Literacy (05/07/2024)   B1300 Health Literacy    Frequency of need for help with medical instructions: Never    Family History  Problem Relation Age of Onset   Meniere's disease Father     Health Maintenance  Topic Date Due   Hepatitis C Screening  Never done   Influenza Vaccine  02/16/2025 (Originally 06/19/2024)   Hepatitis B Vaccines 19-59 Average Risk (1 of 3 - 19+ 3-dose series) 11/06/2025 (Originally 03/16/2007)   HPV VACCINES (1 - 3-dose SCDM series) 11/06/2025 (Originally 03/16/2015)   DTaP/Tdap/Td (4 - Td or Tdap) 05/07/2034   HIV Screening  Completed   Pneumococcal Vaccine  Aged Out   Meningococcal B Vaccine  Aged Out   COVID-19 Vaccine  Discontinued     ----------------------------------------------------------------------------------------------------------------------------------------------------------------------------------------------------------------- Physical Exam BP 129/87 (BP Location: Left Arm, Patient Position: Sitting, Cuff Size: Normal)   Pulse 87   Temp (!) 97.5 F (36.4 C) (Oral)   Resp 18   Ht 6' (1.829 m)   Wt 271 lb 0.6 oz (122.9 kg)   SpO2 97%   BMI 36.76 kg/m   Physical Exam Constitutional:      General: He is not in acute distress. HENT:     Head: Normocephalic and atraumatic.     Right Ear: Tympanic membrane and external ear normal.     Left Ear: Tympanic membrane and external ear normal.  Eyes:     General: No scleral icterus. Neck:     Thyroid: No thyromegaly.  Cardiovascular:     Rate and Rhythm: Normal rate and regular rhythm.     Heart sounds:  Normal heart sounds.  Pulmonary:     Effort: Pulmonary effort is normal.     Breath sounds: Normal breath sounds.  Abdominal:     General: Bowel sounds are normal. There is no distension.     Palpations: Abdomen is soft.     Tenderness: There is no abdominal tenderness. There is no guarding.  Musculoskeletal:     Cervical back: Normal range of motion.  Lymphadenopathy:     Cervical: No cervical adenopathy.  Skin:    General: Skin is warm and dry.     Findings: No rash.  Neurological:     Mental Status: He  is alert and oriented to person, place, and time.     Cranial Nerves: No cranial nerve deficit.     Motor: No abnormal muscle tone.  Psychiatric:        Mood and Affect: Mood normal.        Behavior: Behavior normal.     ------------------------------------------------------------------------------------------------------------------------------------------------------------------------------------------------------------------- Assessment and Plan  Well adult exam Well adult Orders Placed This Encounter  Procedures   Flu vaccine trivalent PF, 6mos and older(Flulaval,Afluria,Fluarix,Fluzone)  Screenings: Per lab orders Immunizations: Flu vaccine given today Anticipatory guidance/risk factor reduction: Recommendations per AVS  ADD (attention deficit disorder) Doing well with current strength of adderall, will plan to continue.  We'll see if 90 day supply is covered on next fill.    No orders of the defined types were placed in this encounter.   Return in about 6 months (around 05/07/2025) for ADHD.        [1]  Allergies Allergen Reactions   Trazodone  And Nefazodone Other (See Comments)    Night terrors   "

## 2024-11-06 NOTE — Patient Instructions (Signed)
 Preventive Care 11-36 Years Old, Male Preventive care refers to lifestyle choices and visits with your health care provider that can promote health and wellness. Preventive care visits are also called wellness exams. What can I expect for my preventive care visit? Counseling During your preventive care visit, your health care provider may ask about your: Medical history, including: Past medical problems. Family medical history. Current health, including: Emotional well-being. Home life and relationship well-being. Sexual activity. Lifestyle, including: Alcohol, nicotine or tobacco, and drug use. Access to firearms. Diet, exercise, and sleep habits. Safety issues such as seatbelt and bike helmet use. Sunscreen use. Work and work Astronomer. Physical exam Your health care provider may check your: Height and weight. These may be used to calculate your BMI (body mass index). BMI is a measurement that tells if you are at a healthy weight. Waist circumference. This measures the distance around your waistline. This measurement also tells if you are at a healthy weight and may help predict your risk of certain diseases, such as type 2 diabetes and high blood pressure. Heart rate and blood pressure. Body temperature. Skin for abnormal spots. What immunizations do I need?  Vaccines are usually given at various ages, according to a schedule. Your health care provider will recommend vaccines for you based on your age, medical history, and lifestyle or other factors, such as travel or where you work. What tests do I need? Screening Your health care provider may recommend screening tests for certain conditions. This may include: Lipid and cholesterol levels. Diabetes screening. This is done by checking your blood sugar (glucose) after you have not eaten for a while (fasting). Hepatitis B test. Hepatitis C test. HIV (human immunodeficiency virus) test. STI (sexually transmitted infection)  testing, if you are at risk. Talk with your health care provider about your test results, treatment options, and if necessary, the need for more tests. Follow these instructions at home: Eating and drinking  Eat a healthy diet that includes fresh fruits and vegetables, whole grains, lean protein, and low-fat dairy products. Drink enough fluid to keep your urine pale yellow. Take vitamin and mineral supplements as recommended by your health care provider. Do not drink alcohol if your health care provider tells you not to drink. If you drink alcohol: Limit how much you have to 0-2 drinks a day. Know how much alcohol is in your drink. In the [place] one drink equals one 12 oz bottle of beer (355 mL), one 5 oz glass of wine (148 mL), or one 1 oz glass of hard liquor (44 mL). Lifestyle Brush your teeth every morning and night with fluoride toothpaste. Floss one time each day. Exercise for at least 30 minutes 5 or more days each week. Do not use any products that contain nicotine or tobacco. These products include cigarettes, chewing tobacco, and vaping devices, such as e-cigarettes. If you need help quitting, ask your health care provider. Do not use drugs. If you are sexually active, practice safe sex. Use a condom or other form of protection to prevent STIs. Find healthy ways to manage stress, such as: Meditation, yoga, or listening to music. Journaling. Talking to a trusted person. Spending time with friends and family. Minimize exposure to UV radiation to reduce your risk of skin cancer. Safety Always wear your seat belt while driving or riding in a vehicle. Do not drive: If you have been drinking alcohol. Do not ride with someone who has been drinking. If you have been using any mind-altering substances  or drugs. While texting. When you are tired or distracted. Wear a helmet and other protective equipment during sports activities. If you have firearms in your house, make sure you  follow all gun safety procedures. Seek help if you have been physically or sexually abused. What's next? Go to your health care provider once a year for an annual wellness visit. Ask your health care provider how often you should have your eyes and teeth checked. Stay up to date on all vaccines. This information is not intended to replace advice given to you by your health care provider. Make sure you discuss any questions you have with your health care provider. Document Revised: 05/03/2021 Document Reviewed: 05/03/2021 Elsevier Patient Education  2024 [employer].

## 2024-11-06 NOTE — Assessment & Plan Note (Signed)
 Doing well with current strength of adderall, will plan to continue.  We'll see if 90 day supply is covered on next fill.

## 2024-11-06 NOTE — Assessment & Plan Note (Signed)
 Well adult Orders Placed This Encounter  Procedures   Flu vaccine trivalent PF, 6mos and older(Flulaval,Afluria,Fluarix,Fluzone)  Screenings: Per lab orders Immunizations: Flu vaccine given today Anticipatory guidance/risk factor reduction: Recommendations per AVS

## 2024-12-17 ENCOUNTER — Telehealth: Payer: Self-pay

## 2024-12-17 ENCOUNTER — Encounter: Payer: Self-pay | Admitting: Family Medicine

## 2024-12-17 ENCOUNTER — Other Ambulatory Visit: Payer: Self-pay | Admitting: Family Medicine

## 2024-12-17 MED ORDER — OSELTAMIVIR PHOSPHATE 75 MG PO CAPS: 75.0000 mg | ORAL_CAPSULE | Freq: Two times a day (BID) | ORAL | 0 refills | Status: AC

## 2024-12-17 NOTE — Telephone Encounter (Signed)
 Spoke with patient - states his old baby just tested positive for flu. He states he does have a low grade fever , post nasal drip and feels slightly under the weather .   He will do a home Covid/ flu test as recommended by Dr. Alvia and is requesting the prophylactic prescription of Tamiflu  be sent to [facility] in [place].

## 2024-12-17 NOTE — Telephone Encounter (Signed)
 Patient informed and is on his way to pick up the Tamiflu  at time of call.

## 2024-12-17 NOTE — Telephone Encounter (Signed)
 The patient called back stating he was at the pharmacy and they have not received the prescription for Tamiflu . He called and spoke with Tonya who put [place] on the line. The patient just picked up home tests and he will go home and do the test and message back after.

## 2024-12-21 ENCOUNTER — Encounter: Payer: Self-pay | Admitting: Family Medicine

## 2024-12-21 ENCOUNTER — Other Ambulatory Visit: Payer: Self-pay | Admitting: Family Medicine

## 2024-12-21 MED ORDER — AMPHETAMINE-DEXTROAMPHETAMINE 30 MG PO TABS: 30.0000 mg | ORAL_TABLET | Freq: Three times a day (TID) | ORAL | 0 refills | Status: AC

## 2025-05-28 ENCOUNTER — Ambulatory Visit: Admitting: Family Medicine
